# Patient Record
Sex: Female | Born: 1945
Health system: Southern US, Community
[De-identification: ages and names within clinical notes are randomized; demographics above are authoritative.]

## PROBLEM LIST (undated history)

## (undated) DIAGNOSIS — R011 Cardiac murmur, unspecified: Secondary | ICD-10-CM

## (undated) DIAGNOSIS — R519 Headache, unspecified: Secondary | ICD-10-CM

## (undated) DIAGNOSIS — K7460 Unspecified cirrhosis of liver: Secondary | ICD-10-CM

## (undated) DIAGNOSIS — I8500 Esophageal varices without bleeding: Secondary | ICD-10-CM

## (undated) DIAGNOSIS — R51 Headache: Secondary | ICD-10-CM

## (undated) DIAGNOSIS — G459 Transient cerebral ischemic attack, unspecified: Secondary | ICD-10-CM

## (undated) DIAGNOSIS — I10 Essential (primary) hypertension: Secondary | ICD-10-CM

## (undated) HISTORY — PX: BREAST CYST ASPIRATION: SHX578

## (undated) HISTORY — PX: URINARY SURGERY: SHX2626

## (undated) HISTORY — DX: Transient cerebral ischemic attack, unspecified: G45.9

## (undated) HISTORY — PX: WRIST SURGERY: SHX841

## (undated) HISTORY — PX: CHOLECYSTECTOMY: SHX55

---

## 2012-03-01 DIAGNOSIS — J019 Acute sinusitis, unspecified: Secondary | ICD-10-CM | POA: Diagnosis not present

## 2012-04-21 DIAGNOSIS — R0982 Postnasal drip: Secondary | ICD-10-CM | POA: Diagnosis not present

## 2012-04-21 DIAGNOSIS — J019 Acute sinusitis, unspecified: Secondary | ICD-10-CM | POA: Diagnosis not present

## 2012-04-25 DIAGNOSIS — L821 Other seborrheic keratosis: Secondary | ICD-10-CM | POA: Diagnosis not present

## 2012-04-25 DIAGNOSIS — L253 Unspecified contact dermatitis due to other chemical products: Secondary | ICD-10-CM | POA: Diagnosis not present

## 2012-04-25 DIAGNOSIS — D2360 Other benign neoplasm of skin of unspecified upper limb, including shoulder: Secondary | ICD-10-CM | POA: Diagnosis not present

## 2012-04-25 DIAGNOSIS — D239 Other benign neoplasm of skin, unspecified: Secondary | ICD-10-CM | POA: Diagnosis not present

## 2012-05-10 DIAGNOSIS — Z23 Encounter for immunization: Secondary | ICD-10-CM | POA: Diagnosis not present

## 2012-05-10 DIAGNOSIS — L659 Nonscarring hair loss, unspecified: Secondary | ICD-10-CM | POA: Diagnosis not present

## 2012-05-10 DIAGNOSIS — M899 Disorder of bone, unspecified: Secondary | ICD-10-CM | POA: Diagnosis not present

## 2012-05-10 DIAGNOSIS — Z0000 Encounter for general adult medical examination without abnormal findings: Secondary | ICD-10-CM | POA: Diagnosis not present

## 2012-05-10 DIAGNOSIS — I10 Essential (primary) hypertension: Secondary | ICD-10-CM | POA: Diagnosis not present

## 2012-05-11 ENCOUNTER — Other Ambulatory Visit (HOSPITAL_COMMUNITY): Payer: Self-pay | Admitting: Internal Medicine

## 2012-05-11 DIAGNOSIS — Z1231 Encounter for screening mammogram for malignant neoplasm of breast: Secondary | ICD-10-CM

## 2012-05-22 DIAGNOSIS — M899 Disorder of bone, unspecified: Secondary | ICD-10-CM | POA: Diagnosis not present

## 2012-05-22 DIAGNOSIS — E559 Vitamin D deficiency, unspecified: Secondary | ICD-10-CM | POA: Diagnosis not present

## 2012-05-22 DIAGNOSIS — G43009 Migraine without aura, not intractable, without status migrainosus: Secondary | ICD-10-CM | POA: Diagnosis not present

## 2012-05-22 DIAGNOSIS — I10 Essential (primary) hypertension: Secondary | ICD-10-CM | POA: Diagnosis not present

## 2012-05-22 DIAGNOSIS — M949 Disorder of cartilage, unspecified: Secondary | ICD-10-CM | POA: Diagnosis not present

## 2012-06-05 ENCOUNTER — Ambulatory Visit (HOSPITAL_COMMUNITY)
Admission: RE | Admit: 2012-06-05 | Discharge: 2012-06-05 | Disposition: A | Discharge status: Home / Self Care | Payer: Medicare Other | Source: Ambulatory Visit | Attending: Internal Medicine | Admitting: Internal Medicine

## 2012-06-05 DIAGNOSIS — Z1231 Encounter for screening mammogram for malignant neoplasm of breast: Secondary | ICD-10-CM | POA: Insufficient documentation

## 2012-07-03 DIAGNOSIS — M1610 Unilateral primary osteoarthritis, unspecified hip: Secondary | ICD-10-CM | POA: Diagnosis not present

## 2012-07-03 DIAGNOSIS — M169 Osteoarthritis of hip, unspecified: Secondary | ICD-10-CM | POA: Diagnosis not present

## 2012-07-03 DIAGNOSIS — M25569 Pain in unspecified knee: Secondary | ICD-10-CM | POA: Diagnosis not present

## 2012-11-20 DIAGNOSIS — I10 Essential (primary) hypertension: Secondary | ICD-10-CM | POA: Diagnosis not present

## 2012-11-20 DIAGNOSIS — M949 Disorder of cartilage, unspecified: Secondary | ICD-10-CM | POA: Diagnosis not present

## 2012-11-20 DIAGNOSIS — M899 Disorder of bone, unspecified: Secondary | ICD-10-CM | POA: Diagnosis not present

## 2012-11-27 DIAGNOSIS — I10 Essential (primary) hypertension: Secondary | ICD-10-CM | POA: Diagnosis not present

## 2012-11-27 DIAGNOSIS — M899 Disorder of bone, unspecified: Secondary | ICD-10-CM | POA: Diagnosis not present

## 2012-11-27 DIAGNOSIS — H9319 Tinnitus, unspecified ear: Secondary | ICD-10-CM | POA: Diagnosis not present

## 2012-11-27 DIAGNOSIS — Z23 Encounter for immunization: Secondary | ICD-10-CM | POA: Diagnosis not present

## 2012-11-27 DIAGNOSIS — G43009 Migraine without aura, not intractable, without status migrainosus: Secondary | ICD-10-CM | POA: Diagnosis not present

## 2012-11-27 DIAGNOSIS — M949 Disorder of cartilage, unspecified: Secondary | ICD-10-CM | POA: Diagnosis not present

## 2013-06-19 DIAGNOSIS — M949 Disorder of cartilage, unspecified: Secondary | ICD-10-CM | POA: Diagnosis not present

## 2013-06-19 DIAGNOSIS — I10 Essential (primary) hypertension: Secondary | ICD-10-CM | POA: Diagnosis not present

## 2013-06-19 DIAGNOSIS — M899 Disorder of bone, unspecified: Secondary | ICD-10-CM | POA: Diagnosis not present

## 2013-06-19 DIAGNOSIS — Z1331 Encounter for screening for depression: Secondary | ICD-10-CM | POA: Diagnosis not present

## 2013-06-19 DIAGNOSIS — Z0000 Encounter for general adult medical examination without abnormal findings: Secondary | ICD-10-CM | POA: Diagnosis not present

## 2013-06-21 ENCOUNTER — Other Ambulatory Visit (HOSPITAL_COMMUNITY): Payer: Self-pay | Admitting: Internal Medicine

## 2013-06-21 DIAGNOSIS — Z1231 Encounter for screening mammogram for malignant neoplasm of breast: Secondary | ICD-10-CM

## 2013-06-25 DIAGNOSIS — G43009 Migraine without aura, not intractable, without status migrainosus: Secondary | ICD-10-CM | POA: Diagnosis not present

## 2013-06-25 DIAGNOSIS — I10 Essential (primary) hypertension: Secondary | ICD-10-CM | POA: Diagnosis not present

## 2013-06-25 DIAGNOSIS — M899 Disorder of bone, unspecified: Secondary | ICD-10-CM | POA: Diagnosis not present

## 2013-06-27 ENCOUNTER — Ambulatory Visit (HOSPITAL_COMMUNITY)
Admission: RE | Admit: 2013-06-27 | Discharge: 2013-06-27 | Disposition: A | Discharge status: Home / Self Care | Payer: Medicare Other | Source: Ambulatory Visit | Attending: Internal Medicine | Admitting: Internal Medicine

## 2013-06-27 DIAGNOSIS — Z1231 Encounter for screening mammogram for malignant neoplasm of breast: Secondary | ICD-10-CM | POA: Diagnosis not present

## 2014-01-22 DIAGNOSIS — J019 Acute sinusitis, unspecified: Secondary | ICD-10-CM | POA: Diagnosis not present

## 2014-01-22 DIAGNOSIS — M79609 Pain in unspecified limb: Secondary | ICD-10-CM | POA: Diagnosis not present

## 2014-04-08 DIAGNOSIS — I10 Essential (primary) hypertension: Secondary | ICD-10-CM | POA: Diagnosis not present

## 2014-04-08 DIAGNOSIS — M899 Disorder of bone, unspecified: Secondary | ICD-10-CM | POA: Diagnosis not present

## 2014-04-08 DIAGNOSIS — M949 Disorder of cartilage, unspecified: Secondary | ICD-10-CM | POA: Diagnosis not present

## 2014-04-16 DIAGNOSIS — N9489 Other specified conditions associated with female genital organs and menstrual cycle: Secondary | ICD-10-CM | POA: Diagnosis not present

## 2014-04-16 DIAGNOSIS — M949 Disorder of cartilage, unspecified: Secondary | ICD-10-CM | POA: Diagnosis not present

## 2014-04-16 DIAGNOSIS — G43009 Migraine without aura, not intractable, without status migrainosus: Secondary | ICD-10-CM | POA: Diagnosis not present

## 2014-04-16 DIAGNOSIS — M899 Disorder of bone, unspecified: Secondary | ICD-10-CM | POA: Diagnosis not present

## 2014-04-16 DIAGNOSIS — I10 Essential (primary) hypertension: Secondary | ICD-10-CM | POA: Diagnosis not present

## 2014-04-17 DIAGNOSIS — M19049 Primary osteoarthritis, unspecified hand: Secondary | ICD-10-CM | POA: Diagnosis not present

## 2014-04-19 DIAGNOSIS — M19049 Primary osteoarthritis, unspecified hand: Secondary | ICD-10-CM | POA: Diagnosis not present

## 2014-05-03 DIAGNOSIS — S60529A Blister (nonthermal) of unspecified hand, initial encounter: Secondary | ICD-10-CM | POA: Diagnosis not present

## 2014-05-03 DIAGNOSIS — M19049 Primary osteoarthritis, unspecified hand: Secondary | ICD-10-CM | POA: Diagnosis not present

## 2014-05-10 DIAGNOSIS — IMO0001 Reserved for inherently not codable concepts without codable children: Secondary | ICD-10-CM | POA: Diagnosis not present

## 2014-06-04 DIAGNOSIS — M19049 Primary osteoarthritis, unspecified hand: Secondary | ICD-10-CM | POA: Diagnosis not present

## 2014-06-04 DIAGNOSIS — G8918 Other acute postprocedural pain: Secondary | ICD-10-CM | POA: Diagnosis not present

## 2014-06-17 DIAGNOSIS — Z471 Aftercare following joint replacement surgery: Secondary | ICD-10-CM | POA: Diagnosis not present

## 2014-06-17 DIAGNOSIS — Z96698 Presence of other orthopedic joint implants: Secondary | ICD-10-CM | POA: Diagnosis not present

## 2014-07-01 DIAGNOSIS — Z471 Aftercare following joint replacement surgery: Secondary | ICD-10-CM | POA: Diagnosis not present

## 2014-07-01 DIAGNOSIS — M19049 Primary osteoarthritis, unspecified hand: Secondary | ICD-10-CM | POA: Diagnosis not present

## 2014-07-01 DIAGNOSIS — Z96698 Presence of other orthopedic joint implants: Secondary | ICD-10-CM | POA: Diagnosis not present

## 2014-07-09 DIAGNOSIS — H43819 Vitreous degeneration, unspecified eye: Secondary | ICD-10-CM | POA: Diagnosis not present

## 2014-07-11 DIAGNOSIS — M79609 Pain in unspecified limb: Secondary | ICD-10-CM | POA: Diagnosis not present

## 2014-07-17 DIAGNOSIS — M79609 Pain in unspecified limb: Secondary | ICD-10-CM | POA: Diagnosis not present

## 2014-07-23 DIAGNOSIS — M79609 Pain in unspecified limb: Secondary | ICD-10-CM | POA: Diagnosis not present

## 2014-08-01 DIAGNOSIS — M19049 Primary osteoarthritis, unspecified hand: Secondary | ICD-10-CM | POA: Diagnosis not present

## 2014-08-01 DIAGNOSIS — M79609 Pain in unspecified limb: Secondary | ICD-10-CM | POA: Diagnosis not present

## 2014-08-01 DIAGNOSIS — Z471 Aftercare following joint replacement surgery: Secondary | ICD-10-CM | POA: Diagnosis not present

## 2014-08-08 DIAGNOSIS — M79609 Pain in unspecified limb: Secondary | ICD-10-CM | POA: Diagnosis not present

## 2014-08-14 DIAGNOSIS — M79609 Pain in unspecified limb: Secondary | ICD-10-CM | POA: Diagnosis not present

## 2014-08-18 DIAGNOSIS — J0100 Acute maxillary sinusitis, unspecified: Secondary | ICD-10-CM | POA: Diagnosis not present

## 2014-08-22 DIAGNOSIS — M79609 Pain in unspecified limb: Secondary | ICD-10-CM | POA: Diagnosis not present

## 2014-08-29 DIAGNOSIS — Z96698 Presence of other orthopedic joint implants: Secondary | ICD-10-CM | POA: Diagnosis not present

## 2014-09-04 DIAGNOSIS — M79609 Pain in unspecified limb: Secondary | ICD-10-CM | POA: Diagnosis not present

## 2014-09-19 DIAGNOSIS — M79645 Pain in left finger(s): Secondary | ICD-10-CM | POA: Diagnosis not present

## 2014-10-07 DIAGNOSIS — Z0000 Encounter for general adult medical examination without abnormal findings: Secondary | ICD-10-CM | POA: Diagnosis not present

## 2014-10-07 DIAGNOSIS — I10 Essential (primary) hypertension: Secondary | ICD-10-CM | POA: Diagnosis not present

## 2014-10-07 DIAGNOSIS — Z1389 Encounter for screening for other disorder: Secondary | ICD-10-CM | POA: Diagnosis not present

## 2014-10-07 DIAGNOSIS — Z23 Encounter for immunization: Secondary | ICD-10-CM | POA: Diagnosis not present

## 2014-10-07 DIAGNOSIS — M949 Disorder of cartilage, unspecified: Secondary | ICD-10-CM | POA: Diagnosis not present

## 2014-10-07 DIAGNOSIS — N390 Urinary tract infection, site not specified: Secondary | ICD-10-CM | POA: Diagnosis not present

## 2014-10-08 ENCOUNTER — Other Ambulatory Visit (HOSPITAL_COMMUNITY): Payer: Self-pay | Admitting: Internal Medicine

## 2014-10-08 DIAGNOSIS — Z1231 Encounter for screening mammogram for malignant neoplasm of breast: Secondary | ICD-10-CM

## 2014-10-16 ENCOUNTER — Other Ambulatory Visit: Payer: Self-pay | Admitting: Internal Medicine

## 2014-10-16 DIAGNOSIS — G43009 Migraine without aura, not intractable, without status migrainosus: Secondary | ICD-10-CM | POA: Diagnosis not present

## 2014-10-16 DIAGNOSIS — I10 Essential (primary) hypertension: Secondary | ICD-10-CM | POA: Diagnosis not present

## 2014-10-16 DIAGNOSIS — M169 Osteoarthritis of hip, unspecified: Secondary | ICD-10-CM | POA: Diagnosis not present

## 2014-10-16 DIAGNOSIS — M859 Disorder of bone density and structure, unspecified: Secondary | ICD-10-CM | POA: Diagnosis not present

## 2014-10-16 DIAGNOSIS — R748 Abnormal levels of other serum enzymes: Secondary | ICD-10-CM

## 2014-10-17 DIAGNOSIS — M79645 Pain in left finger(s): Secondary | ICD-10-CM | POA: Diagnosis not present

## 2014-10-18 ENCOUNTER — Ambulatory Visit
Admission: RE | Admit: 2014-10-18 | Discharge: 2014-10-18 | Disposition: A | Discharge status: Home / Self Care | Payer: Medicare Other | Source: Ambulatory Visit | Attending: Internal Medicine | Admitting: Internal Medicine

## 2014-10-18 DIAGNOSIS — R748 Abnormal levels of other serum enzymes: Secondary | ICD-10-CM

## 2014-10-18 DIAGNOSIS — Z9049 Acquired absence of other specified parts of digestive tract: Secondary | ICD-10-CM | POA: Diagnosis not present

## 2014-10-24 DIAGNOSIS — M79645 Pain in left finger(s): Secondary | ICD-10-CM | POA: Diagnosis not present

## 2014-10-30 ENCOUNTER — Ambulatory Visit (HOSPITAL_COMMUNITY)
Admission: RE | Admit: 2014-10-30 | Discharge: 2014-10-30 | Disposition: A | Discharge status: Home / Self Care | Payer: Medicare Other | Source: Ambulatory Visit | Attending: Internal Medicine | Admitting: Internal Medicine

## 2014-10-30 DIAGNOSIS — Z1231 Encounter for screening mammogram for malignant neoplasm of breast: Secondary | ICD-10-CM | POA: Diagnosis not present

## 2015-03-05 DIAGNOSIS — R05 Cough: Secondary | ICD-10-CM | POA: Diagnosis not present

## 2015-03-05 DIAGNOSIS — R509 Fever, unspecified: Secondary | ICD-10-CM | POA: Diagnosis not present

## 2015-03-05 DIAGNOSIS — J209 Acute bronchitis, unspecified: Secondary | ICD-10-CM | POA: Diagnosis not present

## 2015-03-12 DIAGNOSIS — R05 Cough: Secondary | ICD-10-CM | POA: Diagnosis not present

## 2015-03-12 DIAGNOSIS — J309 Allergic rhinitis, unspecified: Secondary | ICD-10-CM | POA: Diagnosis not present

## 2015-04-21 DIAGNOSIS — M859 Disorder of bone density and structure, unspecified: Secondary | ICD-10-CM | POA: Diagnosis not present

## 2015-04-21 DIAGNOSIS — I10 Essential (primary) hypertension: Secondary | ICD-10-CM | POA: Diagnosis not present

## 2015-04-30 DIAGNOSIS — M859 Disorder of bone density and structure, unspecified: Secondary | ICD-10-CM | POA: Diagnosis not present

## 2015-04-30 DIAGNOSIS — G43009 Migraine without aura, not intractable, without status migrainosus: Secondary | ICD-10-CM | POA: Diagnosis not present

## 2015-04-30 DIAGNOSIS — J309 Allergic rhinitis, unspecified: Secondary | ICD-10-CM | POA: Diagnosis not present

## 2015-04-30 DIAGNOSIS — I10 Essential (primary) hypertension: Secondary | ICD-10-CM | POA: Diagnosis not present

## 2015-05-13 DIAGNOSIS — Z1211 Encounter for screening for malignant neoplasm of colon: Secondary | ICD-10-CM | POA: Diagnosis not present

## 2015-05-13 DIAGNOSIS — R140 Abdominal distension (gaseous): Secondary | ICD-10-CM | POA: Diagnosis not present

## 2015-05-23 DIAGNOSIS — Z1211 Encounter for screening for malignant neoplasm of colon: Secondary | ICD-10-CM | POA: Diagnosis not present

## 2015-05-23 DIAGNOSIS — K5730 Diverticulosis of large intestine without perforation or abscess without bleeding: Secondary | ICD-10-CM | POA: Diagnosis not present

## 2015-07-11 DIAGNOSIS — M79644 Pain in right finger(s): Secondary | ICD-10-CM | POA: Diagnosis not present

## 2015-07-11 DIAGNOSIS — Z471 Aftercare following joint replacement surgery: Secondary | ICD-10-CM | POA: Diagnosis not present

## 2015-07-11 DIAGNOSIS — M778 Other enthesopathies, not elsewhere classified: Secondary | ICD-10-CM | POA: Diagnosis not present

## 2015-07-11 DIAGNOSIS — M79645 Pain in left finger(s): Secondary | ICD-10-CM | POA: Diagnosis not present

## 2015-10-24 DIAGNOSIS — R399 Unspecified symptoms and signs involving the genitourinary system: Secondary | ICD-10-CM | POA: Diagnosis not present

## 2015-10-24 DIAGNOSIS — N898 Other specified noninflammatory disorders of vagina: Secondary | ICD-10-CM | POA: Diagnosis not present

## 2015-10-24 DIAGNOSIS — T192XXA Foreign body in vulva and vagina, initial encounter: Secondary | ICD-10-CM | POA: Diagnosis not present

## 2015-10-27 DIAGNOSIS — M8580 Other specified disorders of bone density and structure, unspecified site: Secondary | ICD-10-CM | POA: Diagnosis not present

## 2015-10-27 DIAGNOSIS — Z23 Encounter for immunization: Secondary | ICD-10-CM | POA: Diagnosis not present

## 2015-10-27 DIAGNOSIS — Z0000 Encounter for general adult medical examination without abnormal findings: Secondary | ICD-10-CM | POA: Diagnosis not present

## 2015-10-27 DIAGNOSIS — M859 Disorder of bone density and structure, unspecified: Secondary | ICD-10-CM | POA: Diagnosis not present

## 2015-10-27 DIAGNOSIS — Z1389 Encounter for screening for other disorder: Secondary | ICD-10-CM | POA: Diagnosis not present

## 2015-10-27 DIAGNOSIS — I10 Essential (primary) hypertension: Secondary | ICD-10-CM | POA: Diagnosis not present

## 2015-10-27 DIAGNOSIS — N390 Urinary tract infection, site not specified: Secondary | ICD-10-CM | POA: Diagnosis not present

## 2015-10-27 DIAGNOSIS — Z780 Asymptomatic menopausal state: Secondary | ICD-10-CM | POA: Diagnosis not present

## 2015-10-28 ENCOUNTER — Other Ambulatory Visit: Payer: Self-pay

## 2015-10-28 DIAGNOSIS — Z1231 Encounter for screening mammogram for malignant neoplasm of breast: Secondary | ICD-10-CM

## 2015-11-04 DIAGNOSIS — N959 Unspecified menopausal and perimenopausal disorder: Secondary | ICD-10-CM | POA: Diagnosis not present

## 2015-11-04 DIAGNOSIS — N952 Postmenopausal atrophic vaginitis: Secondary | ICD-10-CM | POA: Diagnosis not present

## 2015-11-06 ENCOUNTER — Other Ambulatory Visit: Payer: Self-pay | Admitting: Family Medicine

## 2015-11-06 DIAGNOSIS — N950 Postmenopausal bleeding: Secondary | ICD-10-CM

## 2015-11-06 DIAGNOSIS — R102 Pelvic and perineal pain: Secondary | ICD-10-CM

## 2015-11-07 DIAGNOSIS — N182 Chronic kidney disease, stage 2 (mild): Secondary | ICD-10-CM | POA: Diagnosis not present

## 2015-11-07 DIAGNOSIS — I129 Hypertensive chronic kidney disease with stage 1 through stage 4 chronic kidney disease, or unspecified chronic kidney disease: Secondary | ICD-10-CM | POA: Diagnosis not present

## 2015-11-07 DIAGNOSIS — G43009 Migraine without aura, not intractable, without status migrainosus: Secondary | ICD-10-CM | POA: Diagnosis not present

## 2015-11-07 DIAGNOSIS — M8580 Other specified disorders of bone density and structure, unspecified site: Secondary | ICD-10-CM | POA: Diagnosis not present

## 2015-11-17 ENCOUNTER — Ambulatory Visit
Admission: RE | Admit: 2015-11-17 | Discharge: 2015-11-17 | Disposition: A | Discharge status: Home / Self Care | Payer: Medicare Other | Source: Ambulatory Visit | Attending: Family Medicine | Admitting: Family Medicine

## 2015-11-17 DIAGNOSIS — N950 Postmenopausal bleeding: Secondary | ICD-10-CM

## 2015-11-17 DIAGNOSIS — R102 Pelvic and perineal pain: Secondary | ICD-10-CM

## 2015-11-18 ENCOUNTER — Ambulatory Visit: Payer: PRIVATE HEALTH INSURANCE

## 2015-11-19 ENCOUNTER — Ambulatory Visit
Admission: RE | Admit: 2015-11-19 | Discharge: 2015-11-19 | Disposition: A | Discharge status: Home / Self Care | Payer: Medicare Other | Source: Ambulatory Visit

## 2015-11-19 DIAGNOSIS — Z1231 Encounter for screening mammogram for malignant neoplasm of breast: Secondary | ICD-10-CM

## 2015-11-25 DIAGNOSIS — N882 Stricture and stenosis of cervix uteri: Secondary | ICD-10-CM | POA: Diagnosis not present

## 2015-11-25 DIAGNOSIS — N952 Postmenopausal atrophic vaginitis: Secondary | ICD-10-CM | POA: Diagnosis not present

## 2015-11-25 DIAGNOSIS — R102 Pelvic and perineal pain: Secondary | ICD-10-CM | POA: Diagnosis not present

## 2015-11-25 DIAGNOSIS — N950 Postmenopausal bleeding: Secondary | ICD-10-CM | POA: Diagnosis not present

## 2015-12-23 ENCOUNTER — Encounter (HOSPITAL_COMMUNITY)
Admission: RE | Admit: 2015-12-23 | Discharge: 2015-12-23 | Disposition: A | Discharge status: Home / Self Care | Payer: Medicare Other | Source: Ambulatory Visit | Attending: Obstetrics & Gynecology | Admitting: Obstetrics & Gynecology

## 2015-12-23 ENCOUNTER — Encounter (HOSPITAL_COMMUNITY): Payer: Self-pay

## 2015-12-23 ENCOUNTER — Other Ambulatory Visit: Payer: Self-pay

## 2015-12-23 DIAGNOSIS — Z01818 Encounter for other preprocedural examination: Secondary | ICD-10-CM | POA: Diagnosis not present

## 2015-12-23 DIAGNOSIS — N950 Postmenopausal bleeding: Secondary | ICD-10-CM | POA: Diagnosis not present

## 2015-12-23 DIAGNOSIS — N882 Stricture and stenosis of cervix uteri: Secondary | ICD-10-CM | POA: Diagnosis not present

## 2015-12-23 HISTORY — DX: Headache: R51

## 2015-12-23 HISTORY — DX: Cardiac murmur, unspecified: R01.1

## 2015-12-23 HISTORY — DX: Headache, unspecified: R51.9

## 2015-12-23 LAB — CBC
HCT: 42.6 % (ref 36.0–46.0)
Hemoglobin: 14.5 g/dL (ref 12.0–15.0)
MCH: 29.2 pg (ref 26.0–34.0)
MCHC: 34 g/dL (ref 30.0–36.0)
MCV: 85.9 fL (ref 78.0–100.0)
Platelets: 148 10*3/uL — ABNORMAL LOW (ref 150–400)
RBC: 4.96 MIL/uL (ref 3.87–5.11)
RDW: 13.9 % (ref 11.5–15.5)
WBC: 7 10*3/uL (ref 4.0–10.5)

## 2015-12-23 LAB — COMPREHENSIVE METABOLIC PANEL
ALT: 20 U/L (ref 14–54)
ANION GAP: 9 (ref 5–15)
AST: 24 U/L (ref 15–41)
Albumin: 4.5 g/dL (ref 3.5–5.0)
Alkaline Phosphatase: 153 U/L — ABNORMAL HIGH (ref 38–126)
BUN: 20 mg/dL (ref 6–20)
CHLORIDE: 106 mmol/L (ref 101–111)
CO2: 26 mmol/L (ref 22–32)
CREATININE: 0.88 mg/dL (ref 0.44–1.00)
Calcium: 9.8 mg/dL (ref 8.9–10.3)
GFR calc Af Amer: >60 mL/min (ref >60)
GFR calc non Af Amer: >60 mL/min (ref >60)
Glucose, Bld: 114 mg/dL — ABNORMAL HIGH (ref 65–99)
Potassium: 3.6 mmol/L (ref 3.5–5.1)
SODIUM: 141 mmol/L (ref 135–145)
Total Bilirubin: 0.5 mg/dL (ref 0.3–1.2)
Total Protein: 7.9 g/dL (ref 6.5–8.1)

## 2015-12-23 NOTE — Patient Instructions (Addendum)
   Your procedure is scheduled on: January 9 (MONDAY)  Enter through the Main Entrance of [facility] at: [place] up the phone at the desk and dial [phone] and inform us of your arrival.  Please call this number if you have any problems the morning of surgery: [phone]  DO NOT EAT OR DRINK AFTER MIDNIGHT January 8 (SUNDAY)   Take these medicines the morning of surgery with a SIP OF WATER: take micardis day of surgery  Do not wear jewelry, make-up, or FINGER nail polish No metal in your hair or on your body. Do not wear lotions, powders, perfumes.  You may wear deodorant.  Do not bring valuables to the hospital. Contacts, dentures or bridgework may not be worn into surgery.   Patients discharged on the day of surgery will not be allowed to drive home.

## 2015-12-23 NOTE — H&P (Signed)
70yo PM female who presents for hysteroscopy D&C due to postmenopausal bleeding with cervical stenosis.  Since October she has had very light old blood spotting- sometimes its more than a few drops, most of the time its very minimal. Denies BRB or heavy bleeding.  Pelvic US performed: 6cm uterus- no uterine abnromalities noted. 1.31mm endometrial stripe. Normal right ovary, left ovary not visualized. Of note ~ 75mm of non-specific fluid noted in endocervical canal.  Unable to perform EMB in office due to cervical stenosis.  Current Medications  Taking   Telmisartan 80 MG Tablet Once a day   Calcium + D3 600-200 MG-UNIT Tablet once a day   Ginkgo Biloba 120 MG Capsule 2 tablets once a day   Fish Oil 1000 MG Capsule once a day   Vitamin D3 2000 UNIT Capsule Once a day            Past Medical History  Atrophic vaginitis  Hypertension  Vitamin D deficiency   laparoscopic cholecystectomy microscopic 2008  tubal ligation 1974  mesh for uterus dropping 2005   Family History  Father: deceased 63 yrs, Passed due to massive cerebral hemorrhage  Mother: deceased 66 yrs, Passed due to an accident  Brother 1: alive 31 yrs, diagnosed with Prostate Ca  Brother2: alive 74 yrs, diagnosed with Prostate Ca  Brother 3: alive  Sister 1: alive 71 yrs, alzheimer, diagnosed with HTN  Sister 2: alive, diagnosed with HTN  Sister 3: alive  11 brothers, four sisters all togather. Positive family Hx for colon cancer., denies any GYN family cancer hx.   Social History  General:  Tobacco use  cigarettes: Never smoked Tobacco history last updated 11/25/2015 no Alcohol.  Caffeine: yes, 1 serving daily coffee, , occasionally tea.  no Recreational drug use.  Exercise: yes, daily, walks.  Marital Status: married.  Children: 1 girl, 1 son.  OCCUPATION: retired.    Gyn History  Sexual activity not currently sexually active.  Periods : postmenopausal.  Birth control BTL.  Last pap smear date More than 5  years ago.  Last mammogram date 11/19/2015.  Denies H/O Abnormal pap smear.  Denies H/O STD.    OB History  Pregnancy # 1 live birth, vaginal delivery.  Pregnancy # 2 live birth, vaginal delivery.    Allergies  N.K.D.A.   Hospitalization/Major Diagnostic Procedure  child birth 7  child birth 62   Review of Systems  CONSTITUTIONAL:  no Appetite changes. no Chills. no Fatigue. no Fever.  CARDIOLOGY:  no Chest pain.  RESPIRATORY:  no Shortness of breath. no Cough.  UROLOGY:  no Dysuria. no Urinary frequency. no Urinary incontinence. no Urinary urgency.  GASTROENTEROLOGY:  no Change in bowel habits. no Change in bowel movements.  FEMALE REPRODUCTIVE:  no Breast lumps or discharge. no Breast pain. no Hot flashes. no Sexual problems. no Vaginal itching.  NEUROLOGY:  no Dizziness. no Headache.  PSYCHOLOGY:  no Anxiety. no Depression.  SKIN:  no Rash. no Hives.  HEMATOLOGY/LYMPH:  no Anemia. Using Blood Thinners no.    O: Examination performed in office  GENERAL APPEARANCE well nourished, well developed.  SKIN: warm & dry, no rashes.  NECK: supple, normal appearance, thyroid not enlarged.  BREASTS: no palpable masses bilaterally, no nipple discharge bilaterally.  LUNGS: clear to auscultation bilaterally, no wheezes, rhonchi, rales.  HEART: no murmurs, regular rate and rhythm.  ABDOMEN: no masses palpated, soft and not tender, no rebound, no guarding, no reproducible pain.  FEMALE GENITOURINARY: Normal urethra, No  external lesions, Vagina - pink pale mucosa, no lesions or abnormal discharge, cervix - visualized, cervical stenosis noted, cervix - no discharge or lesions or CMT, adnexa - no masses or tenderness, uterus - nontender and normal size on palpation.  EXTREMITIES: no edema present, no calf tenderness bilaterally, normal range of motion.  LYMPH NODES: no inguinal adenopathy, no axillary adenopathy.  PSYCH oriented x 3, appropriate mood and affect.    A/P: 70yo  postmenopausal female who presents for hysteroscopy, D&C -NPO -LR @ 125cc/hr -SCDs to OR -antibiotics not indicated -Risk/benefit and indications of procedure reviewed with patient.  Questions and concerns were addressed and pt wishes to proceed  Janyth Pupa, DO [phone] (pager) [phone] (office)

## 2015-12-29 ENCOUNTER — Encounter (HOSPITAL_COMMUNITY)
Admission: RE | Disposition: A | Discharge status: Home / Self Care | Payer: Self-pay | Source: Ambulatory Visit | Attending: Obstetrics & Gynecology

## 2015-12-29 ENCOUNTER — Ambulatory Visit (HOSPITAL_COMMUNITY): Payer: Medicare Other | Admitting: Certified Registered Nurse Anesthetist

## 2015-12-29 ENCOUNTER — Ambulatory Visit (HOSPITAL_COMMUNITY)
Admission: RE | Admit: 2015-12-29 | Discharge: 2015-12-29 | Disposition: A | Discharge status: Home / Self Care | Payer: Medicare Other | Source: Ambulatory Visit | Attending: Obstetrics & Gynecology | Admitting: Obstetrics & Gynecology

## 2015-12-29 DIAGNOSIS — I10 Essential (primary) hypertension: Secondary | ICD-10-CM | POA: Diagnosis not present

## 2015-12-29 DIAGNOSIS — M4802 Spinal stenosis, cervical region: Secondary | ICD-10-CM | POA: Diagnosis not present

## 2015-12-29 DIAGNOSIS — N882 Stricture and stenosis of cervix uteri: Secondary | ICD-10-CM | POA: Diagnosis not present

## 2015-12-29 DIAGNOSIS — N858 Other specified noninflammatory disorders of uterus: Secondary | ICD-10-CM | POA: Diagnosis not present

## 2015-12-29 DIAGNOSIS — N950 Postmenopausal bleeding: Secondary | ICD-10-CM | POA: Diagnosis not present

## 2015-12-29 HISTORY — PX: HYSTEROSCOPY WITH D & C: SHX1775

## 2015-12-29 SURGERY — DILATATION AND CURETTAGE /HYSTEROSCOPY
Anesthesia: Monitor Anesthesia Care | Site: Vagina

## 2015-12-29 MED ORDER — SCOPOLAMINE 1 MG/3DAYS TD PT72: 1.0000 | MEDICATED_PATCH | Freq: Once | TRANSDERMAL | Status: DC

## 2015-12-29 MED ORDER — DEXAMETHASONE SODIUM PHOSPHATE 10 MG/ML IJ SOLN
INTRAMUSCULAR | Status: AC
  Filled 2015-12-29: qty 1

## 2015-12-29 MED ORDER — PROPOFOL 500 MG/50ML IV EMUL
INTRAVENOUS | Status: DC | PRN
  Administered 2015-12-29: 200 ug/kg/min via INTRAVENOUS

## 2015-12-29 MED ORDER — FENTANYL CITRATE (PF) 100 MCG/2ML IJ SOLN
INTRAMUSCULAR | Status: AC
Start: 2015-12-29 — End: 2015-12-29
  Filled 2015-12-29: qty 2

## 2015-12-29 MED ORDER — ONDANSETRON HCL 4 MG/2ML IJ SOLN
INTRAMUSCULAR | Status: AC
  Filled 2015-12-29: qty 2

## 2015-12-29 MED ORDER — LIDOCAINE HCL 1 % IJ SOLN
INTRAMUSCULAR | Status: AC
  Filled 2015-12-29: qty 20

## 2015-12-29 MED ORDER — LIDOCAINE HCL (CARDIAC) 20 MG/ML IV SOLN
INTRAVENOUS | Status: AC
  Filled 2015-12-29: qty 5

## 2015-12-29 MED ORDER — FENTANYL CITRATE (PF) 100 MCG/2ML IJ SOLN: 25.0000 ug | INTRAMUSCULAR | Status: DC | PRN

## 2015-12-29 MED ORDER — KETOROLAC TROMETHAMINE 30 MG/ML IJ SOLN
INTRAMUSCULAR | Status: AC
  Filled 2015-12-29: qty 1

## 2015-12-29 MED ORDER — GLYCOPYRROLATE 0.2 MG/ML IJ SOLN
INTRAMUSCULAR | Status: AC
  Filled 2015-12-29: qty 1

## 2015-12-29 MED ORDER — PROPOFOL 10 MG/ML IV BOLUS
INTRAVENOUS | Status: AC
  Filled 2015-12-29: qty 20

## 2015-12-29 MED ORDER — ACETAMINOPHEN 160 MG/5ML PO SOLN: 325.0000 mg | ORAL | Status: DC | PRN

## 2015-12-29 MED ORDER — MIDAZOLAM HCL 2 MG/2ML IJ SOLN
INTRAMUSCULAR | Status: DC | PRN
  Administered 2015-12-29: 1 mg via INTRAVENOUS

## 2015-12-29 MED ORDER — OXYCODONE HCL 5 MG/5ML PO SOLN: 5.0000 mg | Freq: Once | ORAL | Status: DC | PRN

## 2015-12-29 MED ORDER — LIDOCAINE HCL 1 % IJ SOLN
INTRAMUSCULAR | Status: DC | PRN
  Administered 2015-12-29: 10 mL

## 2015-12-29 MED ORDER — LIDOCAINE HCL (CARDIAC) 20 MG/ML IV SOLN
INTRAVENOUS | Status: DC | PRN
  Administered 2015-12-29 (×2): 50 mg via INTRAVENOUS

## 2015-12-29 MED ORDER — LACTATED RINGERS IV SOLN
INTRAVENOUS | Status: DC
  Administered 2015-12-29: 08:00:00 via INTRAVENOUS

## 2015-12-29 MED ORDER — OXYCODONE HCL 5 MG PO TABS: 5.0000 mg | ORAL_TABLET | Freq: Once | ORAL | Status: DC | PRN

## 2015-12-29 MED ORDER — ONDANSETRON HCL 4 MG/2ML IJ SOLN
INTRAMUSCULAR | Status: DC | PRN
  Administered 2015-12-29: 4 mg via INTRAVENOUS

## 2015-12-29 MED ORDER — SODIUM CHLORIDE 0.9 % IR SOLN
Status: DC | PRN
  Administered 2015-12-29: 3000 mL

## 2015-12-29 MED ORDER — FENTANYL CITRATE (PF) 100 MCG/2ML IJ SOLN
INTRAMUSCULAR | Status: DC | PRN
  Administered 2015-12-29 (×2): 25 ug via INTRAVENOUS

## 2015-12-29 MED ORDER — GLYCOPYRROLATE 0.2 MG/ML IJ SOLN
INTRAMUSCULAR | Status: DC | PRN
  Administered 2015-12-29: 0.1 mg via INTRAVENOUS

## 2015-12-29 MED ORDER — LACTATED RINGERS IV SOLN: INTRAVENOUS | Status: DC

## 2015-12-29 MED ORDER — ACETAMINOPHEN 325 MG PO TABS: 325.0000 mg | ORAL_TABLET | ORAL | Status: DC | PRN

## 2015-12-29 MED ORDER — DEXAMETHASONE SODIUM PHOSPHATE 10 MG/ML IJ SOLN
INTRAMUSCULAR | Status: DC | PRN
  Administered 2015-12-29: 5 mg via INTRAVENOUS

## 2015-12-29 MED ORDER — MIDAZOLAM HCL 2 MG/2ML IJ SOLN
INTRAMUSCULAR | Status: AC
  Filled 2015-12-29: qty 2

## 2015-12-29 SURGICAL SUPPLY — 17 items
CANISTER SUCT 3000ML (MISCELLANEOUS) ×2 IMPLANT
CATH ROBINSON RED A/P 16FR (CATHETERS) ×2 IMPLANT
CLOTH BEACON ORANGE TIMEOUT ST (SAFETY) ×2 IMPLANT
CONTAINER PREFILL 10% NBF 60ML (FORM) ×4 IMPLANT
DILATOR CANAL MILEX (MISCELLANEOUS) ×2 IMPLANT
GLOVE BIOGEL PI IND STRL 6.5 (GLOVE) ×2 IMPLANT
GLOVE BIOGEL PI IND STRL 7.0 (GLOVE) ×2 IMPLANT
GLOVE BIOGEL PI INDICATOR 6.5 (GLOVE) ×2
GLOVE BIOGEL PI INDICATOR 7.0 (GLOVE) ×2
GLOVE ECLIPSE 6.5 STRL STRAW (GLOVE) ×2 IMPLANT
GOWN STRL REUS W/TWL LRG LVL3 (GOWN DISPOSABLE) ×4 IMPLANT
PACK VAGINAL MINOR WOMEN LF (CUSTOM PROCEDURE TRAY) ×2 IMPLANT
PAD OB MATERNITY 4.3X12.25 (PERSONAL CARE ITEMS) ×2 IMPLANT
TOWEL OR 17X24 6PK STRL BLUE (TOWEL DISPOSABLE) ×4 IMPLANT
TUBING AQUILEX INFLOW (TUBING) ×2 IMPLANT
TUBING AQUILEX OUTFLOW (TUBING) ×2 IMPLANT
WATER STERILE IRR 1000ML POUR (IV SOLUTION) ×2 IMPLANT

## 2015-12-29 NOTE — Op Note (Signed)
Operative Report  PreOp: postmenopausal bleeding, cervical stenosis PostOp: same Procedure:  Hysteroscopy, Dilation and Curettage Surgeon: Dr. Janyth Pupa Anesthesia: MAC Complications:uterine perforation EBL: 5cc UOP: 20cc IVF:700cc Deficit: 60cc  Findings:Cervical stenosis, 6cm uterus with thin endometrium  Specimens: 1) ECC 2) EMB   Procedure: The patient was taken to the operating room where she underwent conscious sedation. The patient was placed in a low lithotomy position using Allen stirrups. She was prepped and draped in the normal sterile fashion. The bladder was drained using a red rubber urethral catheter. A sterile speculum was inserted into the vagina. A single tooth tenaculum was placed on the anterior lip of the cervix. 10cc of 1% lidocaine with injected for a cervical block. Stenosis was noted- using the os finder- the uterus was dilated.  Mucus was noted upon opening of the cervix and likely explains the US findings.  The uterus was then sounded to 6cm. The endocervical canal was then serially dilated to 14French using Hank dilators.  The diagnostic hysteroscope was then inserted without difficulty and noted to have the findings as listed above. Of note a small dark area noted at the fundus- likely injury from the dilation- no active bleeding was seen.  The hysteroscope was removed and sharp curettage was performed. The tissue was sent to pathology. The patient was repositioned to the supine position. Pt was monitored with no evidence of active bleeding.  The patient tolerated the procedure with small suspected uterine perforation as mentioned above.  She was taken to recovery in stable condition.  Janyth Pupa, DO [phone] (pager) [phone] (office)

## 2015-12-29 NOTE — Discharge Instructions (Addendum)
HOME INSTRUCTIONS  Please note any unusual or excessive bleeding, pain, swelling. Mild dizziness or drowsiness are normal for about 24 hours after surgery.   Shower when comfortable  Restrictions: No driving for 24 hours or while taking pain medications.  Activity:  No heavy lifting (> 10 lbs), nothing in vagina (no tampons, douching, or intercourse) x 2 weeks; no tub baths for 2 weeks Vaginal spotting is expected but if your bleeding is heavy, period like,  please call the office    Diet:  You may return to your regular diet.  Do not eat large meals.  Eat small frequent meals throughout the day.  Continue to drink a good amount of water at least 6-8 glasses of water per day, hydration is very important for the healing process.  Pain Management: Take Motrin and/or Tylenol as needed for pain.  Always take prescription pain medication with food, it may cause constipation, increase fluids and fiber and you may want to take an over-the-counter stool softener like Colace as needed up to 2x a day.    Alcohol -- Avoid for 24 hours and while taking pain medications.  Nausea: Take sips of ginger ale or soda  Fever -- Call physician if temperature over 101 degrees  Follow up:  If you do not already have a follow up appointment scheduled, please call the office at [phone].  If you experience fever (a temperature greater than 100.4), pain unrelieved by pain medication, shortness of breath, swelling of a single leg, or any other symptoms which are concerning to you please the office immediately.DISCHARGE INSTRUCTIONS: D&C / D&E The following instructions have been prepared to help you care for yourself upon your return home.   Personal hygiene:  Use sanitary pads for vaginal drainage, not tampons.  Shower the day after your procedure.  NO tub baths, pools or Jacuzzis for 2-3 weeks.  Wipe front to back after using the bathroom.  Activity and limitations:  Do NOT drive or operate any  equipment for 24 hours. The effects of anesthesia are still present and drowsiness may result.  Do NOT rest in bed all day.  Walking is encouraged.  Walk up and down stairs slowly.  You may resume your normal activity in one to two days or as indicated by your physician.  Sexual activity: NO intercourse for at least 2 weeks after the procedure, or as indicated by your physician.  Diet: Eat a light meal as desired this evening. You may resume your usual diet tomorrow.  Return to work: You may resume your work activities in one to two days or as indicated by your doctor.  What to expect after your surgery: Expect to have vaginal bleeding/discharge for 2-3 days and spotting for up to 10 days. It is not unusual to have soreness for up to 1-2 weeks. You may have a slight burning sensation when you urinate for the first day. Mild cramps may continue for a couple of days. You may have a regular period in 2-6 weeks.  Call your doctor for any of the following:  Excessive vaginal bleeding, saturating and changing one pad every hour.  Inability to urinate 6 hours after discharge from hospital.  Pain not relieved by pain medication.  Fever of 100.4 F or greater.  Unusual vaginal discharge or odor.   Call for an appointment:    Patients signature: ______________________  Nurses signature ________________________  Support person's signature_______________________

## 2015-12-29 NOTE — Anesthesia Preprocedure Evaluation (Signed)
Anesthesia Evaluation  Patient identified by MRN, date of birth, ID band Patient awake    Reviewed: Allergy & Precautions, NPO status , Patient's Chart, lab work & pertinent test results  History of Anesthesia Complications Negative for: history of anesthetic complications  Airway Mallampati: II  TM Distance: >3 FB Neck ROM: Full    Dental  (+) Teeth Intact   Pulmonary neg pulmonary ROS,    breath sounds clear to auscultation       Cardiovascular hypertension, Pt. on medications  Rhythm:Regular     Neuro/Psych negative neurological ROS  negative psych ROS   GI/Hepatic negative GI ROS, Neg liver ROS,   Endo/Other  negative endocrine ROS  Renal/GU negative Renal ROS     Musculoskeletal negative musculoskeletal ROS (+)   Abdominal   Peds  Hematology negative hematology ROS (+)   Anesthesia Other Findings   Reproductive/Obstetrics                             Anesthesia Physical Anesthesia Plan  ASA: II  Anesthesia Plan: MAC   Post-op Pain Management:    Induction: Intravenous  Airway Management Planned: Natural Airway, Simple Face Mask and Nasal Cannula  Additional Equipment: None  Intra-op Plan:   Post-operative Plan:   Informed Consent: I have reviewed the patients History and Physical, chart, labs and discussed the procedure including the risks, benefits and alternatives for the proposed anesthesia with the patient or authorized representative who has indicated his/her understanding and acceptance.   Dental advisory given  Plan Discussed with: CRNA and Surgeon  Anesthesia Plan Comments:         Anesthesia Quick Evaluation

## 2015-12-29 NOTE — Transfer of Care (Signed)
Immediate Anesthesia Transfer of Care Note  Patient: Terri Turner  Procedure(s) Performed: Procedure(s): DILATATION AND CURETTAGE /HYSTEROSCOPY (N/A)  Patient Location: PACU  Anesthesia Type:MAC  Level of Consciousness: awake, alert  and oriented  Airway & Oxygen Therapy: Patient Spontanous Breathing and Patient connected to nasal cannula oxygen  Post-op Assessment: Report given to RN, Post -op Vital signs reviewed and stable and Patient moving all extremities X 4  Post vital signs: Reviewed and stable  Last Vitals:  Filed Vitals:   12/29/15 0816  BP: 138/79  Pulse: 69  Temp: 36.4 C  Resp: 20    Complications: No apparent anesthesia complications

## 2015-12-29 NOTE — Interval H&P Note (Signed)
History and Physical Interval Note:  12/29/2015 8:35 AM  Terri Turner  has presented today for surgery, with the diagnosis of N95.0  PMB N88.2 Cervical Stenosis  The various methods of treatment have been discussed with the patient and family. After consideration of risks, benefits and other options for treatment, the patient has consented to  Procedure(s): DILATATION AND CURETTAGE /HYSTEROSCOPY (N/A) as a surgical intervention .  The patient's history has been reviewed, patient examined, no change in status, stable for surgery.  I have reviewed the patient's chart and labs.  Questions were answered to the patient's satisfaction.     Janyth Pupa, M

## 2015-12-29 NOTE — Anesthesia Postprocedure Evaluation (Signed)
Anesthesia Post Note  Patient: Terri Turner  Procedure(s) Performed: Procedure(s) (LRB): DILATATION AND CURETTAGE /HYSTEROSCOPY (N/A)  Patient location during evaluation: PACU Anesthesia Type: MAC Level of consciousness: awake Pain management: pain level controlled Vital Signs Assessment: post-procedure vital signs reviewed and stable Respiratory status: spontaneous breathing Cardiovascular status: stable Postop Assessment: no signs of nausea or vomiting and adequate PO intake Anesthetic complications: no    Last Vitals:  Filed Vitals:   12/29/15 1045 12/29/15 1047  BP: 121/70   Pulse: 65 73  Temp:  36.7 C  Resp: 13 18    Last Pain: There were no vitals filed for this visit.               [place]

## 2015-12-30 ENCOUNTER — Encounter (HOSPITAL_COMMUNITY): Payer: Self-pay | Admitting: Obstetrics & Gynecology

## 2016-01-12 DIAGNOSIS — Z4889 Encounter for other specified surgical aftercare: Secondary | ICD-10-CM | POA: Diagnosis not present

## 2016-01-12 DIAGNOSIS — N952 Postmenopausal atrophic vaginitis: Secondary | ICD-10-CM | POA: Diagnosis not present

## 2016-01-12 DIAGNOSIS — N898 Other specified noninflammatory disorders of vagina: Secondary | ICD-10-CM | POA: Diagnosis not present

## 2016-01-12 DIAGNOSIS — N362 Urethral caruncle: Secondary | ICD-10-CM | POA: Diagnosis not present

## 2016-04-30 DIAGNOSIS — M8580 Other specified disorders of bone density and structure, unspecified site: Secondary | ICD-10-CM | POA: Diagnosis not present

## 2016-04-30 DIAGNOSIS — E559 Vitamin D deficiency, unspecified: Secondary | ICD-10-CM | POA: Diagnosis not present

## 2016-04-30 DIAGNOSIS — I129 Hypertensive chronic kidney disease with stage 1 through stage 4 chronic kidney disease, or unspecified chronic kidney disease: Secondary | ICD-10-CM | POA: Diagnosis not present

## 2016-05-04 DIAGNOSIS — I129 Hypertensive chronic kidney disease with stage 1 through stage 4 chronic kidney disease, or unspecified chronic kidney disease: Secondary | ICD-10-CM | POA: Diagnosis not present

## 2016-05-04 DIAGNOSIS — M8580 Other specified disorders of bone density and structure, unspecified site: Secondary | ICD-10-CM | POA: Diagnosis not present

## 2016-05-04 DIAGNOSIS — E559 Vitamin D deficiency, unspecified: Secondary | ICD-10-CM | POA: Diagnosis not present

## 2016-05-04 DIAGNOSIS — I10 Essential (primary) hypertension: Secondary | ICD-10-CM | POA: Diagnosis not present

## 2016-05-06 ENCOUNTER — Other Ambulatory Visit: Payer: Self-pay | Admitting: Obstetrics & Gynecology

## 2016-05-06 DIAGNOSIS — N950 Postmenopausal bleeding: Secondary | ICD-10-CM | POA: Diagnosis not present

## 2016-05-06 DIAGNOSIS — N362 Urethral caruncle: Secondary | ICD-10-CM | POA: Diagnosis not present

## 2016-05-06 DIAGNOSIS — R319 Hematuria, unspecified: Secondary | ICD-10-CM | POA: Diagnosis not present

## 2016-05-25 DIAGNOSIS — G43009 Migraine without aura, not intractable, without status migrainosus: Secondary | ICD-10-CM | POA: Diagnosis not present

## 2016-05-25 DIAGNOSIS — N182 Chronic kidney disease, stage 2 (mild): Secondary | ICD-10-CM | POA: Diagnosis not present

## 2016-05-25 DIAGNOSIS — M8580 Other specified disorders of bone density and structure, unspecified site: Secondary | ICD-10-CM | POA: Diagnosis not present

## 2016-05-25 DIAGNOSIS — I129 Hypertensive chronic kidney disease with stage 1 through stage 4 chronic kidney disease, or unspecified chronic kidney disease: Secondary | ICD-10-CM | POA: Diagnosis not present

## 2016-05-25 DIAGNOSIS — Z23 Encounter for immunization: Secondary | ICD-10-CM | POA: Diagnosis not present

## 2016-06-03 ENCOUNTER — Encounter (HOSPITAL_COMMUNITY): Payer: Self-pay | Admitting: Emergency Medicine

## 2016-06-03 DIAGNOSIS — Z823 Family history of stroke: Secondary | ICD-10-CM | POA: Diagnosis not present

## 2016-06-03 DIAGNOSIS — I10 Essential (primary) hypertension: Secondary | ICD-10-CM | POA: Insufficient documentation

## 2016-06-03 DIAGNOSIS — G459 Transient cerebral ischemic attack, unspecified: Principal | ICD-10-CM | POA: Insufficient documentation

## 2016-06-03 DIAGNOSIS — R531 Weakness: Secondary | ICD-10-CM | POA: Diagnosis present

## 2016-06-03 DIAGNOSIS — I6782 Cerebral ischemia: Secondary | ICD-10-CM | POA: Diagnosis not present

## 2016-06-03 DIAGNOSIS — R4789 Other speech disturbances: Secondary | ICD-10-CM | POA: Diagnosis not present

## 2016-06-03 DIAGNOSIS — R54 Age-related physical debility: Secondary | ICD-10-CM | POA: Insufficient documentation

## 2016-06-03 DIAGNOSIS — Z79899 Other long term (current) drug therapy: Secondary | ICD-10-CM | POA: Diagnosis not present

## 2016-06-03 LAB — CBC
HCT: 40.7 % (ref 36.0–46.0)
Hemoglobin: 13.4 g/dL (ref 12.0–15.0)
MCH: 27.9 pg (ref 26.0–34.0)
MCHC: 32.9 g/dL (ref 30.0–36.0)
MCV: 84.6 fL (ref 78.0–100.0)
PLATELETS: 138 10*3/uL — AB (ref 150–400)
RBC: 4.81 MIL/uL (ref 3.87–5.11)
RDW: 13.7 % (ref 11.5–15.5)
WBC: 5.6 10*3/uL (ref 4.0–10.5)

## 2016-06-03 LAB — URINALYSIS, ROUTINE W REFLEX MICROSCOPIC
Bilirubin Urine: NEGATIVE
Glucose, UA: NEGATIVE mg/dL
Hgb urine dipstick: NEGATIVE
Ketones, ur: NEGATIVE mg/dL
Nitrite: NEGATIVE
PH: 5.5 (ref 5.0–8.0)
Protein, ur: NEGATIVE mg/dL
SPECIFIC GRAVITY, URINE: 1.026 (ref 1.005–1.030)

## 2016-06-03 LAB — BASIC METABOLIC PANEL
Anion gap: 7 (ref 5–15)
BUN: 14 mg/dL (ref 6–20)
CALCIUM: 9.4 mg/dL (ref 8.9–10.3)
CHLORIDE: 107 mmol/L (ref 101–111)
CO2: 24 mmol/L (ref 22–32)
CREATININE: 0.89 mg/dL (ref 0.44–1.00)
GFR calc non Af Amer: >60 mL/min (ref >60)
GLUCOSE: 106 mg/dL — AB (ref 65–99)
Potassium: 3.6 mmol/L (ref 3.5–5.1)
Sodium: 138 mmol/L (ref 135–145)

## 2016-06-03 LAB — URINE MICROSCOPIC-ADD ON

## 2016-06-03 NOTE — ED Notes (Signed)
Pt. reports brief left side body weakness while taking a shower at 10 am this morning , alert and oriented at arrival , speech clear/no facial; asymmetry , equal grips with no arm drift , ambulatory . Denies pain / respirations unlabored.

## 2016-06-04 ENCOUNTER — Observation Stay (HOSPITAL_BASED_OUTPATIENT_CLINIC_OR_DEPARTMENT_OTHER): Payer: Medicare Other

## 2016-06-04 ENCOUNTER — Observation Stay (HOSPITAL_COMMUNITY): Payer: Medicare Other

## 2016-06-04 ENCOUNTER — Encounter (HOSPITAL_COMMUNITY): Payer: Self-pay | Admitting: Internal Medicine

## 2016-06-04 ENCOUNTER — Other Ambulatory Visit (HOSPITAL_COMMUNITY): Payer: Medicare Other

## 2016-06-04 ENCOUNTER — Observation Stay (HOSPITAL_COMMUNITY)
Admission: EM | Admit: 2016-06-04 | Discharge: 2016-06-04 | Disposition: A | Discharge status: Home / Self Care | Payer: Medicare Other | Attending: Internal Medicine | Admitting: Internal Medicine

## 2016-06-04 ENCOUNTER — Emergency Department (HOSPITAL_COMMUNITY): Payer: Medicare Other

## 2016-06-04 DIAGNOSIS — I10 Essential (primary) hypertension: Secondary | ICD-10-CM

## 2016-06-04 DIAGNOSIS — R011 Cardiac murmur, unspecified: Secondary | ICD-10-CM | POA: Diagnosis not present

## 2016-06-04 DIAGNOSIS — I6782 Cerebral ischemia: Secondary | ICD-10-CM | POA: Diagnosis not present

## 2016-06-04 DIAGNOSIS — G459 Transient cerebral ischemic attack, unspecified: Secondary | ICD-10-CM

## 2016-06-04 DIAGNOSIS — G458 Other transient cerebral ischemic attacks and related syndromes: Secondary | ICD-10-CM

## 2016-06-04 HISTORY — DX: Essential (primary) hypertension: I10

## 2016-06-04 LAB — RAPID URINE DRUG SCREEN, HOSP PERFORMED
AMPHETAMINES: NOT DETECTED
BARBITURATES: NOT DETECTED
BENZODIAZEPINES: NOT DETECTED
COCAINE: NOT DETECTED
Opiates: NOT DETECTED
TETRAHYDROCANNABINOL: NOT DETECTED

## 2016-06-04 LAB — ECHOCARDIOGRAM COMPLETE
E decel time: 254 msec
FS: 28 % (ref 28–44)
Height: 60 in
IV/PV OW: 0.92
LA diam end sys: 32 mm
LA diam index: 2.06 cm/m2
LASIZE: 32 mm
LAVOL: 39.8 mL
LAVOLA4C: 36.6 mL
LAVOLIN: 25.7 mL/m2
LV TDI E'MEDIAL: 7.18
LV e' LATERAL: 6.96 cm/s
MV Dec: 254
MVPKEVEL: 1 m/s
PW: 13 mm — AB (ref 0.6–1.1)
Reg peak vel: 267 cm/s
TDI e' lateral: 6.96
TRMAXVEL: 267 cm/s
Weight: 2067.2 oz

## 2016-06-04 LAB — I-STAT CHEM 8, ED
BUN: 17 mg/dL (ref 6–20)
CALCIUM ION: 1.22 mmol/L (ref 1.13–1.30)
CHLORIDE: 105 mmol/L (ref 101–111)
CREATININE: 0.7 mg/dL (ref 0.44–1.00)
GLUCOSE: 88 mg/dL (ref 65–99)
HCT: 45 % (ref 36.0–46.0)
Hemoglobin: 15.3 g/dL — ABNORMAL HIGH (ref 12.0–15.0)
Potassium: 3.9 mmol/L (ref 3.5–5.1)
Sodium: 142 mmol/L (ref 135–145)
TCO2: 28 mmol/L (ref 0–100)

## 2016-06-04 LAB — PROTIME-INR
INR: 1.16 (ref 0.00–1.49)
Prothrombin Time: 15 seconds (ref 11.6–15.2)

## 2016-06-04 LAB — APTT: aPTT: 33 seconds (ref 24–37)

## 2016-06-04 LAB — MRSA PCR SCREENING: MRSA by PCR: NEGATIVE

## 2016-06-04 LAB — LIPID PANEL
Cholesterol: 191 mg/dL (ref 0–200)
HDL: 63 mg/dL (ref >40)
LDL CALC: 111 mg/dL — AB (ref 0–99)
TRIGLYCERIDES: 85 mg/dL (ref <150)
Total CHOL/HDL Ratio: 3 RATIO
VLDL: 17 mg/dL (ref 0–40)

## 2016-06-04 LAB — I-STAT TROPONIN, ED: TROPONIN I, POC: 0 ng/mL (ref 0.00–0.08)

## 2016-06-04 LAB — GLUCOSE, CAPILLARY: GLUCOSE-CAPILLARY: 86 mg/dL (ref 65–99)

## 2016-06-04 MED ORDER — CO Q 10 10 MG PO CAPS: 1.0000 | ORAL_CAPSULE | Freq: Every day | ORAL | Status: DC

## 2016-06-04 MED ORDER — ATORVASTATIN CALCIUM 20 MG PO TABS: 20.0000 mg | ORAL_TABLET | Freq: Every day | ORAL | Status: DC

## 2016-06-04 MED ORDER — OMEGA-3-ACID ETHYL ESTERS 1 G PO CAPS
1.0000 g | ORAL_CAPSULE | Freq: Every day | ORAL | Status: DC
  Administered 2016-06-04: 1 g via ORAL
  Filled 2016-06-04: qty 1

## 2016-06-04 MED ORDER — HYDRALAZINE HCL 20 MG/ML IJ SOLN: 5.0000 mg | INTRAMUSCULAR | Status: DC | PRN

## 2016-06-04 MED ORDER — ASPIRIN 325 MG PO TABS: 325.0000 mg | ORAL_TABLET | Freq: Every day | ORAL | Status: DC

## 2016-06-04 MED ORDER — SENNOSIDES-DOCUSATE SODIUM 8.6-50 MG PO TABS
1.0000 | ORAL_TABLET | Freq: Every evening | ORAL | Status: DC | PRN
  Filled 2016-06-04: qty 1

## 2016-06-04 MED ORDER — ASPIRIN 325 MG PO TABS
325.0000 mg | ORAL_TABLET | Freq: Every day | ORAL | Status: DC
  Administered 2016-06-04: 325 mg via ORAL
  Filled 2016-06-04: qty 1

## 2016-06-04 MED ORDER — ENOXAPARIN SODIUM 40 MG/0.4ML ~~LOC~~ SOLN
40.0000 mg | SUBCUTANEOUS | Status: DC
  Administered 2016-06-04: 40 mg via SUBCUTANEOUS
  Filled 2016-06-04: qty 0.4

## 2016-06-04 MED ORDER — VITAMIN D 1000 UNITS PO TABS
2000.0000 [IU] | ORAL_TABLET | Freq: Every day | ORAL | Status: DC
  Administered 2016-06-04: 2000 [IU] via ORAL
  Filled 2016-06-04: qty 2

## 2016-06-04 MED ORDER — OMEPRAZOLE 40 MG PO CPDR: 40.0000 mg | DELAYED_RELEASE_CAPSULE | Freq: Every day | ORAL | Status: DC

## 2016-06-04 MED ORDER — IRBESARTAN 300 MG PO TABS
300.0000 mg | ORAL_TABLET | Freq: Every day | ORAL | Status: DC
  Administered 2016-06-04: 300 mg via ORAL
  Filled 2016-06-04: qty 1

## 2016-06-04 MED ORDER — FLUTICASONE PROPIONATE 50 MCG/ACT NA SUSP
1.0000 | Freq: Every day | NASAL | Status: DC | PRN
  Filled 2016-06-04: qty 16

## 2016-06-04 MED ORDER — GINKGO BILOBA 40 MG PO CAPS: 2.0000 | ORAL_CAPSULE | Freq: Every day | ORAL | Status: DC

## 2016-06-04 MED ORDER — STROKE: EARLY STAGES OF RECOVERY BOOK
Freq: Once | Status: AC
  Administered 2016-06-04: 05:00:00
  Filled 2016-06-04: qty 1

## 2016-06-04 MED ORDER — ASPIRIN 300 MG RE SUPP: 300.0000 mg | Freq: Every day | RECTAL | Status: DC

## 2016-06-04 MED ORDER — ACETAMINOPHEN 325 MG PO TABS
650.0000 mg | ORAL_TABLET | Freq: Four times a day (QID) | ORAL | Status: DC | PRN
  Administered 2016-06-04: 650 mg via ORAL
  Filled 2016-06-04: qty 2

## 2016-06-04 NOTE — Progress Notes (Signed)
Preliminary results by tech - Carotid Duplex Completed. No evidence of a significant stenosis noted in bilateral carotid arteries -1-39%. Vertebral arteries demonstrate antegrade flow. Oda Cogan, BS, RDMS, RVT

## 2016-06-04 NOTE — ED Provider Notes (Signed)
Patient seen/examined in the Emergency Department in conjunction with Midlevel Provider Carlota Raspberry Patient reports left sided weakness earlier, now at baseline Exam : awake/alert, no facial droop, no arm/leg drift Plan: admit for TIA Pt agreeable with plan    Ripley Fraise, MD 06/04/16 [phone]

## 2016-06-04 NOTE — Care Management Obs Status (Signed)
[place] NOTIFICATION   Patient Details  Name: Sreeya Rossiter MRN: [id number] Date of Birth: 1946-06-20   Medicare Observation Status Notification Given:  Yes (MRI negative)    Pollie Friar, RN 06/04/2016, 1:01 PM

## 2016-06-04 NOTE — ED Provider Notes (Signed)
CSN: [id number]     Arrival date & time 06/03/16  1913 History   First MD Initiated Contact with Patient 06/04/16 0024     Chief Complaint  Patient presents with  . Weakness     (Consider location/radiation/quality/duration/timing/severity/associated sxs/prior Treatment) HPI   This is a 70 year old Caucasian female with a history of hypertension who presents with two episodes of weakness and heaviness in her left arm and left foot yesterday. Patient states she was in the shower around 10:30 am when she began to feel a weakness/heaviness in her left arm and had associated dysphagia. She then began feeling the same heaviness in her left foot. She states she was able to get out of the shower and sit down, at which time she took her blood pressure which was 145/70 which she states is high for her. The episode resolved spontaneously after approximately 30 minutes. Later in the day around 6 pm, the heaviness in her left foot returned at which time she decided to come to the emergency room. She reports feeling some "heaviness" in her tongue during these episodes as well which made it difficult for her to speak, but she denies vision changes or facial droop. She has never had anything like this before. Currently asymptomatic  ROS: The patient denies diaphoresis, fever, headache, weakness , confusion, change of vision,  dysphagia, aphagia, shortness of breath,  abdominal pains, nausea, vomiting, diarrhea, lower extremity swelling, rash, neck pain, chest pain  Past Medical History  Diagnosis Date  . Heart murmur     not treated..  . Headache   . Hypertension    Past Surgical History  Procedure Laterality Date  . Cholecystectomy    . Wrist surgery      left thumb (pin)  . Urinary surgery      mesh   . Hysteroscopy w/d&c N/A 12/29/2015    Procedure: DILATATION AND CURETTAGE /HYSTEROSCOPY;  Surgeon: Janyth Pupa, DO;  Location: [facility];  Service: Gynecology;  Laterality: N/A;   No family history on  file. Social History  Substance Use Topics  . Smoking status: Never Smoker   . Smokeless tobacco: Never Used  . Alcohol Use: No   OB History    No data available     Review of Systems  Review of Systems All other systems negative except as documented in the HPI. All pertinent positives and negatives as reviewed in the HPI.   Allergies  Review of patient's allergies indicates no known allergies.  Home Medications   Prior to Admission medications   Medication Sig Start Date End Date Taking? Authorizing Provider  Cholecalciferol (VITAMIN D) 2000 units CAPS Take 2,000 Units by mouth daily.   Yes Historical Provider, MD  Coenzyme Q10 (CO Q 10 PO) Take 1 tablet by mouth daily.   Yes Historical Provider, MD  fluticasone (FLONASE) 50 MCG/ACT nasal spray Place 1 spray into both nostrils daily as needed. 11/13/15  Yes Historical Provider, MD  GINKGO BILOBA PO Take 2 capsules by mouth daily.    Yes Historical Provider, MD  Omega-3 Fatty Acids (FISH OIL PO) Take 1 capsule by mouth daily.   Yes Historical Provider, MD  telmisartan (MICARDIS) 80 MG tablet Take 80 mg by mouth daily. 11/04/15  Yes Historical Provider, MD   BP 173/88 mmHg  Pulse 57  Temp(Src) 99 F (37.2 C) (Oral)  Resp 16  Ht 5\' 1"  (1.549 m)  Wt 58.514 kg  BMI 24.39 kg/m2  SpO2 99% Physical Exam  Constitutional: She appears well-developed and well-nourished. No distress.  HENT:  Head: Normocephalic and atraumatic.  Right Ear: Tympanic membrane and ear canal normal.  Left Ear: Tympanic membrane and ear canal normal.  Nose: Nose normal.  Mouth/Throat: Uvula is midline, oropharynx is clear and moist and mucous membranes are normal.  Eyes: Pupils are equal, round, and reactive to light.  Neck: Normal range of motion. Neck supple.  Cardiovascular: Normal rate and regular rhythm.   Pulmonary/Chest: Effort normal.  Abdominal: Soft.  No signs of abdominal distention  Musculoskeletal:  No LE swelling  Neurological: She  is alert.  Acting at baseline Cranial nerves grossly intact on exam. Pt alert and oriented x 3 Upper and lower extremity strength is symmetrical and physiologic Normal muscular tone No facial droop Coordination intact, no limb ataxia,No pronator drift  Skin: Skin is warm and dry. No rash noted.  Nursing note and vitals reviewed.   ED Course  Procedures (including critical care time) Labs Review Labs Reviewed  BASIC METABOLIC PANEL - Abnormal; Notable for the following:    Glucose, Bld 106 (*)    All other components within normal limits  CBC - Abnormal; Notable for the following:    Platelets 138 (*)    All other components within normal limits  URINALYSIS, ROUTINE W REFLEX MICROSCOPIC (NOT AT [facility]) - Abnormal; Notable for the following:    Leukocytes, UA SMALL (*)    All other components within normal limits  URINE MICROSCOPIC-ADD ON - Abnormal; Notable for the following:    Squamous Epithelial / LPF 0-5 (*)    Bacteria, UA RARE (*)    All other components within normal limits  I-STAT CHEM 8, ED - Abnormal; Notable for the following:    Hemoglobin 15.3 (*)    All other components within normal limits  PROTIME-INR  APTT  I-STAT TROPOININ, ED    Imaging Review Ct Head Wo Contrast  06/04/2016  CLINICAL DATA:  Acute onset of generalized weakness. Initial encounter. EXAM: CT HEAD WITHOUT CONTRAST TECHNIQUE: Contiguous axial images were obtained from the base of the skull through the vertex without intravenous contrast. COMPARISON:  None. FINDINGS: There is no evidence of acute infarction, mass lesion, or intra- or extra-axial hemorrhage on CT. Mild periventricular and subcortical white matter change likely reflects small vessel ischemic microangiopathy. The posterior fossa, including the cerebellum, brainstem and fourth ventricle, is within normal limits. The third and lateral ventricles, and basal ganglia are unremarkable in appearance. The cerebral hemispheres are symmetric in  appearance, with normal gray-white differentiation. No mass effect or midline shift is seen. There is no evidence of fracture; visualized osseous structures are unremarkable in appearance. The visualized portions of the orbits are within normal limits. The paranasal sinuses and mastoid air cells are well-aerated. No significant soft tissue abnormalities are seen. IMPRESSION: 1. No acute intracranial pathology seen on CT. 2. Mild small vessel ischemic microangiopathy. Electronically Signed   By: Garald Balding M.D.   On: 06/04/2016 03:45   I have personally reviewed and evaluated these images and lab results as part of my medical decision-making.   EKG Interpretation   Date/Time:  Thursday June 03 2016 19:27:28 EDT Ventricular Rate:  69 PR Interval:  112 QRS Duration: 74 QT Interval:  412 QTC Calculation: 441 R Axis:   68 Text Interpretation:  Normal sinus rhythm Normal ECG no change from prior  Confirmed by Christy Gentles  MD, [place] ([id number]) on 06/04/2016 12:28:31 AM      MDM  Final diagnoses:  Transient cerebral ischemia, unspecified transient cerebral ischemia type   Negative head CT, pt currently asymptomatic. Story is strongly concerning for TIA. Will admit to [facility] for TIA work-up.  Patient seen by Dr. Christy Gentles as well who agrees with treatment and plan. Pt made aware of concern for TIA and plan for admission and is agreeable.  Admit, obs, [facility], [facility]mit, Dr. Blaine Hamper, ,    Delos Haring, PA-C 06/04/16 [place], MD 06/04/16 [phone]

## 2016-06-04 NOTE — Progress Notes (Signed)
*  PRELIMINARY RESULTS* Echocardiogram 2D Echocardiogram has been performed.  Leavy Cella 06/04/2016, 2:37 PM

## 2016-06-04 NOTE — Progress Notes (Signed)
D/C orders received, pt for D/C home today.  IV and telemetry D/C.  Rx and D/C instructions given with verbalized understanding.  Family at bedside to assist with D/C.  Staff brought pt downstairs via wheelchair.  

## 2016-06-04 NOTE — H&P (Signed)
History and Physical    Terri Turner [id number] DOB: [birth year] DOA: 06/04/2016  Referring MD/NP/PA:   PCP: No primary care provider on file.   Patient coming from:  The patient is coming from home.  At baseline, pt is independent for most of ADL.      Chief Complaint: Left-sided weakness and difficulty speaking  HPI: Terri Turner is a 70 y.o. female with medical history significant of hypertension, headache, who presents with left-sided weakness and difficulty speaking.  Patient reports that she had 2 episode of left-sided weakness and difficulty speaking. First episode happened at about 10 AM. She had weakness in left arm, left foot and lower leg. She also had difficulty speaking, feeding like difficulty using her tongue. It lasted for about 30 minutes, then resolved spontaneously. She had had another similar episode at about 6 PM, which lasted for about 15-20 minutes, then resolved spontaneously. She did not have vision change or hearing loss. No tingling sensations in her extremities. Patient did not have headache, chest pain, dizziness. Patient denies nausea, vomiting, abdominal pain, diarrhea or symptoms of a UTI. No fever or chills.  ED Course: pt was found to have WBC 5.6, negative troponin, temperature normal, bradycardia, electrolytes and renal function okay. Negative CT head for acute intracranial abnormalities.  Review of Systems:   General: no fevers, chills, no changes in body weight, has fatigue HEENT: no blurry vision, hearing changes or sore throat Pulm: no dyspnea, coughing, wheezing CV: no chest pain, no palpitations Abd: no nausea, vomiting, abdominal pain, diarrhea, constipation GU: no dysuria, burning on urination, increased urinary frequency, hematuria  Ext: no leg edema Neuro: had left sided weakness and difficult speaking. No vision change or hearing loss Skin: no rash MSK: No muscle spasm, no deformity, no limitation of range of movement in spin Heme: No  easy bruising.  Travel history: No recent long distant travel.  Allergy: No Known Allergies  Past Medical History  Diagnosis Date  . Heart murmur     not treated..  . Headache   . Hypertension     Past Surgical History  Procedure Laterality Date  . Cholecystectomy    . Wrist surgery      left thumb (pin)  . Urinary surgery      mesh   . Hysteroscopy w/d&c N/A 12/29/2015    Procedure: DILATATION AND CURETTAGE /HYSTEROSCOPY;  Surgeon: Janyth Pupa, DO;  Location: [facility];  Service: Gynecology;  Laterality: N/A;    Social History:  reports that she has never smoked. She has never used smokeless tobacco. She reports that she does not drink alcohol or use illicit drugs.  Family History:  Family History  Problem Relation Age of Onset  . Hypertension Mother   . Stroke Father   . Prostate cancer Brother   . Hypertension Sister   . Alzheimer's disease Sister      Prior to Admission medications   Medication Sig Start Date End Date Taking? Authorizing Provider  Cholecalciferol (VITAMIN D) 2000 units CAPS Take 2,000 Units by mouth daily.   Yes Historical Provider, MD  Coenzyme Q10 (CO Q 10 PO) Take 1 tablet by mouth daily.   Yes Historical Provider, MD  fluticasone (FLONASE) 50 MCG/ACT nasal spray Place 1 spray into both nostrils daily as needed. 11/13/15  Yes Historical Provider, MD  GINKGO BILOBA PO Take 2 capsules by mouth daily.    Yes Historical Provider, MD  Omega-3 Fatty Acids (FISH OIL PO) Take 1 capsule by mouth daily.  Yes Historical Provider, MD  telmisartan (MICARDIS) 80 MG tablet Take 80 mg by mouth daily. 11/04/15  Yes Historical Provider, MD    Physical Exam: Filed Vitals:   06/04/16 0300 06/04/16 0315 06/04/16 0342 06/04/16 0344  BP: 154/83 155/82 173/88   Pulse: 63 56 57   Temp:    99 F (37.2 C)  TempSrc:      Resp: 19 16 16    Height:      Weight:      SpO2: 100% 97% 99%    General: Not in acute distress HEENT:       Eyes: PERRL, EOMI, no scleral  icterus.       ENT: No discharge from the ears and nose, no pharynx injection, no tonsillar enlargement.        Neck: No JVD, no bruit, no mass felt. Heme: No neck lymph node enlargement. Cardiac: S1/S2, RRR, No murmurs, No gallops or rubs. Pulm: No rales, wheezing, rhonchi or rubs. Abd: Soft, nondistended, nontender, no rebound pain, no organomegaly, BS present. GU: No hematuria Ext: No pitting leg edema bilaterally. 2+DP/PT pulse bilaterally. Musculoskeletal: No joint deformities, No joint redness or warmth, no limitation of ROM in spin. Skin: No rashes.  Neuro: Alert, oriented X3, cranial nerves II-XII grossly intact, moves all extremities normally. Muscle strength 5/5 in all extremities, sensation to light touch intact. Knee reflex 1+ bilaterally. Negative Babinski's sign. Normal finger to nose test. Psych: Patient is not psychotic, no suicidal or hemocidal ideation.  Labs on Admission: I have personally reviewed following labs and imaging studies  CBC:  Recent Labs Lab 06/03/16 1936 06/04/16 0354  WBC 5.6  --   HGB 13.4 15.3*  HCT 40.7 45.0  MCV 84.6  --   PLT 138*  --    Basic Metabolic Panel:  Recent Labs Lab 06/03/16 1936 06/04/16 0354  NA 138 142  K 3.6 3.9  CL 107 105  CO2 24  --   GLUCOSE 106* 88  BUN 14 17  CREATININE 0.89 0.70  CALCIUM 9.4  --    GFR: Estimated Creatinine Clearance: 53 mL/min (by C-G formula based on Cr of 0.7). Liver Function Tests: No results for input(s): AST, ALT, ALKPHOS, BILITOT, PROT, ALBUMIN in the last 168 hours. No results for input(s): LIPASE, AMYLASE in the last 168 hours. No results for input(s): AMMONIA in the last 168 hours. Coagulation Profile: No results for input(s): INR, PROTIME in the last 168 hours. Cardiac Enzymes: No results for input(s): CKTOTAL, CKMB, CKMBINDEX, TROPONINI in the last 168 hours. BNP (last 3 results) No results for input(s): PROBNP in the last 8760 hours. HbA1C: No results for input(s):  HGBA1C in the last 72 hours. CBG: No results for input(s): GLUCAP in the last 168 hours. Lipid Profile: No results for input(s): CHOL, HDL, LDLCALC, TRIG, CHOLHDL, LDLDIRECT in the last 72 hours. Thyroid Function Tests: No results for input(s): TSH, T4TOTAL, FREET4, T3FREE, THYROIDAB in the last 72 hours. Anemia Panel: No results for input(s): VITAMINB12, FOLATE, FERRITIN, TIBC, IRON, RETICCTPCT in the last 72 hours. Urine analysis:    Component Value Date/Time   COLORURINE YELLOW 06/03/2016 [place] 06/03/2016 1835   LABSPEC 1.026 06/03/2016 1835   PHURINE 5.5 06/03/2016 [place] NEGATIVE 06/03/2016 1835   HGBUR NEGATIVE 06/03/2016 [place] NEGATIVE 06/03/2016 [place] 06/03/2016 1835   PROTEINUR NEGATIVE 06/03/2016 1835   NITRITE NEGATIVE 06/03/2016 1835   LEUKOCYTESUR SMALL* 06/03/2016 1835  Sepsis Labs: @LABRCNTIP (procalcitonin:4,lacticidven:4) )No results found for this or any previous visit (from the past 240 hour(s)).   Radiological Exams on Admission: Ct Head Wo Contrast  06/04/2016  CLINICAL DATA:  Acute onset of generalized weakness. Initial encounter. EXAM: CT HEAD WITHOUT CONTRAST TECHNIQUE: Contiguous axial images were obtained from the base of the skull through the vertex without intravenous contrast. COMPARISON:  None. FINDINGS: There is no evidence of acute infarction, mass lesion, or intra- or extra-axial hemorrhage on CT. Mild periventricular and subcortical white matter change likely reflects small vessel ischemic microangiopathy. The posterior fossa, including the cerebellum, brainstem and fourth ventricle, is within normal limits. The third and lateral ventricles, and basal ganglia are unremarkable in appearance. The cerebral hemispheres are symmetric in appearance, with normal gray-white differentiation. No mass effect or midline shift is seen. There is no evidence of fracture; visualized osseous structures are  unremarkable in appearance. The visualized portions of the orbits are within normal limits. The paranasal sinuses and mastoid air cells are well-aerated. No significant soft tissue abnormalities are seen. IMPRESSION: 1. No acute intracranial pathology seen on CT. 2. Mild small vessel ischemic microangiopathy. Electronically Signed   By: Garald Balding M.D.   On: 06/04/2016 03:45     EKG: Independently reviewed. Sinus rhythm, QTC 441, early R-wave progression, no ischemic change.  Assessment/Plan Principal Problem:   TIA (transient ischemic attack) Active Problems:   Hypertension   TIA (transient ischemic attack): pt's symptoms are concerning for TIA. Her risk factors include old age, hypertension, and family history of stroke. Currently patient is asymptomatic.  - will place on tele bed for obs -Atrial fibrillation: not present  -tPA was not given because symptoms has resolved completely, and also are out of window for tPA - Risk factor modification: HgbA1c, fasting lipid panel - MRI, MRA of the brain without contrast  - PT consult, OT consult - Bedside swallowing screen was ordered, will get speech consult in AM - 2 d Echocardiogram  - Ekg was done - Carotid dopplers  - start Aspirin - check UDS  Hypertension: bp is 173/88. -Irbesartan -Hydralazine when necessary  DVT ppx:  SQ Lovenox Code Status: Full code Family Communication: None at bed side.  Disposition Plan:  Anticipate discharge back to previous home environment Consults called:  none Admission status: Obs / tele       Date of Service 06/04/2016    Ivor Costa [facility] Pager [phone]  If 7PM-7AM, please contact night-coverage [email or web address] Password [email or web address] 06/04/2016, 5:00 AM

## 2016-06-04 NOTE — Evaluation (Signed)
Speech Language Pathology Evaluation Patient Details Name: Terri Turner MRN: [id number] DOB: 06/04/1946 Today's Date: 06/04/2016 Time: 0713-0723 SLP Time Calculation (min) (ACUTE ONLY): 10 min  Problem List:  Patient Active Problem List   Diagnosis Date Noted  . TIA (transient ischemic attack) 06/04/2016  . Heart murmur   . Hypertension   . Essential hypertension    Past Medical History:  Past Medical History  Diagnosis Date  . Heart murmur     not treated..  . Headache   . Hypertension    Past Surgical History:  Past Surgical History  Procedure Laterality Date  . Cholecystectomy    . Wrist surgery      left thumb (pin)  . Urinary surgery      mesh   . Hysteroscopy w/d&c N/A 12/29/2015    Procedure: DILATATION AND CURETTAGE /HYSTEROSCOPY;  Surgeon: Janyth Pupa, DO;  Location: [facility];  Service: Gynecology;  Laterality: N/A;   HPI:  70 y.o. female with medical history significant of hypertension, headache, who presents with left-sided weakness and difficulty speaking. Negative CT head for acute intracranial abnormalities. MRI pending.   Assessment / Plan / Recommendation Clinical Impression  Pt administered selected subtests of the Cognistat. Time of event pt stated she experienced garbled speech, no confusion. Presently speech is 100% intelligible in conversation, no language or cognitive disturbance. Pt in agreement with no ST services needed.     SLP Assessment  Patient does not need any further Speech Lanaguage Pathology Services    Follow Up Recommendations  None    Frequency and Duration           SLP Evaluation Prior Functioning  Cognitive/Linguistic Baseline: Within functional limits Type of Home: Apartment  Lives With: Spouse Vocation: Retired   Associate Professor  Overall Cognitive Status: Within Functional Limits for tasks assessed Orientation Level: Oriented X4    Comprehension  Auditory Comprehension Overall Auditory Comprehension: Appears within  functional limits for tasks assessed Visual Recognition/Discrimination Discrimination: Not tested Reading Comprehension Reading Status: Not tested    Expression Expression Primary Mode of Expression: Verbal Verbal Expression Overall Verbal Expression: Appears within functional limits for tasks assessed Written Expression Dominant Hand: Right Written Expression: Not tested   Oral / Motor  Oral Motor/Sensory Function Overall Oral Motor/Sensory Function: Within functional limits Motor Speech Overall Motor Speech: Appears within functional limits for tasks assessed   GO          Functional Assessment Tool Used: skilled clinical judgement Functional Limitations: Motor speech Motor Speech Current Status [phone]): 0 percent impaired, limited or restricted Motor Speech Goal Status [phone]): 0 percent impaired, limited or restricted Motor Speech Goal Status [id number]): 0 percent impaired, limited or restricted         Terri Turner 06/04/2016, 7:29 AM  Orbie Pyo Colvin Caroli.Ed [employer] [phone]

## 2016-06-04 NOTE — Discharge Summary (Signed)
Physician Discharge Summary  Braeleigh Pyper MRN: [id number] DOB/AGE: 08/24/1946 70 y.o.  PCP: No primary care provider on file.   Admit date: 06/04/2016 Discharge date: 06/04/2016  Discharge Diagnoses:     Principal Problem:   TIA (transient ischemic attack) Active Problems:   Hypertension    Follow-up recommendations Follow-up with PCP in 3-5 days , including all  additional recommended appointments as below Follow-up CBC, CMP in 3-5 days PCP-Please follow-up on the results of hemoglobin A1c      Current Discharge Medication List    START taking these medications   Details  aspirin 325 MG tablet Take 1 tablet (325 mg total) by mouth daily. Qty: 30 tablet, Refills: 1    atorvastatin (LIPITOR) 20 MG tablet Take 1 tablet (20 mg total) by mouth daily. Qty: 30 tablet, Refills: 1    omeprazole (PRILOSEC) 40 MG capsule Take 1 capsule (40 mg total) by mouth daily. Qty: 30 capsule, Refills: 1      CONTINUE these medications which have NOT CHANGED   Details  Cholecalciferol (VITAMIN D) 2000 units CAPS Take 2,000 Units by mouth daily.    Coenzyme Q10 (CO Q 10 PO) Take 1 tablet by mouth daily.    fluticasone (FLONASE) 50 MCG/ACT nasal spray Place 1 spray into both nostrils daily as needed. Refills: 1    GINKGO BILOBA PO Take 2 capsules by mouth daily.     Omega-3 Fatty Acids (FISH OIL PO) Take 1 capsule by mouth daily.    telmisartan (MICARDIS) 80 MG tablet Take 80 mg by mouth daily.         Discharge Condition: Stable   Discharge Instructions Get Medicines reviewed and adjusted: Please take all your medications with you for your next visit with your Primary MD  Please request your Primary MD to go over all hospital tests and procedure/radiological results at the follow up, please ask your Primary MD to get all Hospital records sent to his/her office.  If you experience worsening of your admission symptoms, develop shortness of breath, life threatening  emergency, suicidal or homicidal thoughts you must seek medical attention immediately by calling 911 or calling your MD immediately if symptoms less severe.  You must read complete instructions/literature along with all the possible adverse reactions/side effects for all the Medicines you take and that have been prescribed to you. Take any new Medicines after you have completely understood and accpet all the possible adverse reactions/side effects.   Do not drive when taking Pain medications.   Do not take more than prescribed Pain, Sleep and Anxiety Medications  Special Instructions: If you have smoked or chewed Tobacco in the last 2 yrs please stop smoking, stop any regular Alcohol and or any Recreational drug use.  Wear Seat belts while driving.  Please note  You were cared for by a hospitalist during your hospital stay. Once you are discharged, your primary care physician will handle any further medical issues. Please note that NO REFILLS for any discharge medications will be authorized once you are discharged, as it is imperative that you return to your primary care physician (or establish a relationship with a primary care physician if you do not have one) for your aftercare needs so that they can reassess your need for medications and monitor your lab values.     No Known Allergies    Disposition: 01-Home or Self Care   Consults: None     Significant Diagnostic Studies:  Ct Head Wo Contrast  06/04/2016  CLINICAL DATA:  Acute onset of generalized weakness. Initial encounter. EXAM: CT HEAD WITHOUT CONTRAST TECHNIQUE: Contiguous axial images were obtained from the base of the skull through the vertex without intravenous contrast. COMPARISON:  None. FINDINGS: There is no evidence of acute infarction, mass lesion, or intra- or extra-axial hemorrhage on CT. Mild periventricular and subcortical white matter change likely reflects small vessel ischemic microangiopathy. The  posterior fossa, including the cerebellum, brainstem and fourth ventricle, is within normal limits. The third and lateral ventricles, and basal ganglia are unremarkable in appearance. The cerebral hemispheres are symmetric in appearance, with normal gray-white differentiation. No mass effect or midline shift is seen. There is no evidence of fracture; visualized osseous structures are unremarkable in appearance. The visualized portions of the orbits are within normal limits. The paranasal sinuses and mastoid air cells are well-aerated. No significant soft tissue abnormalities are seen. IMPRESSION: 1. No acute intracranial pathology seen on CT. 2. Mild small vessel ischemic microangiopathy. Electronically Signed   By: Garald Balding M.D.   On: 06/04/2016 03:45   Mr Brain Wo Contrast  06/04/2016  CLINICAL DATA:  TIA.  Left-sided weakness and speech difficulty EXAM: MRI HEAD WITHOUT CONTRAST MRA HEAD WITHOUT CONTRAST TECHNIQUE: Multiplanar, multiecho pulse sequences of the brain and surrounding structures were obtained without intravenous contrast. Angiographic images of the head were obtained using MRA technique without contrast. COMPARISON:  CT head 06/04/2016 FINDINGS: MRI HEAD FINDINGS Negative for acute infarct Multiple small hyperintensities throughout the cerebral white matter bilaterally consistent with chronic microvascular ischemia. Brainstem and cerebellum normal. Ventricle size normal.  Cerebral volume normal. Negative for hemorrhage or fluid collection. Negative for mass or adenopathy.  No shift of the midline structures Paranasal sinuses clear.  Normal orbital contents. Pituitary and skull base normal. MRA HEAD FINDINGS Both vertebral arteries patent to the basilar. PICA, superior cerebellar, posterior cerebral arteries patent bilaterally without stenosis. Basilar widely patent. Cavernous carotid widely patent bilaterally. Anterior and middle cerebral arteries widely patent bilaterally without  significant stenosis. Negative for cerebral aneurysm. IMPRESSION: Moderate chronic microvascular ischemic change in the white matter. No acute infarct Negative MRA circle of Willis. Electronically Signed   By: Franchot Gallo M.D.   On: 06/04/2016 08:53   Mr Jodene Nam Head/brain Wo Cm  06/04/2016  CLINICAL DATA:  TIA.  Left-sided weakness and speech difficulty EXAM: MRI HEAD WITHOUT CONTRAST MRA HEAD WITHOUT CONTRAST TECHNIQUE: Multiplanar, multiecho pulse sequences of the brain and surrounding structures were obtained without intravenous contrast. Angiographic images of the head were obtained using MRA technique without contrast. COMPARISON:  CT head 06/04/2016 FINDINGS: MRI HEAD FINDINGS Negative for acute infarct Multiple small hyperintensities throughout the cerebral white matter bilaterally consistent with chronic microvascular ischemia. Brainstem and cerebellum normal. Ventricle size normal.  Cerebral volume normal. Negative for hemorrhage or fluid collection. Negative for mass or adenopathy.  No shift of the midline structures Paranasal sinuses clear.  Normal orbital contents. Pituitary and skull base normal. MRA HEAD FINDINGS Both vertebral arteries patent to the basilar. PICA, superior cerebellar, posterior cerebral arteries patent bilaterally without stenosis. Basilar widely patent. Cavernous carotid widely patent bilaterally. Anterior and middle cerebral arteries widely patent bilaterally without significant stenosis. Negative for cerebral aneurysm. IMPRESSION: Moderate chronic microvascular ischemic change in the white matter. No acute infarct Negative MRA circle of Willis. Electronically Signed   By: Franchot Gallo M.D.   On: 06/04/2016 08:53    2-D echo-results pending    Filed Weights   06/03/16 1927 06/04/16 0523  Weight: 58.514  kg (129 lb) 58.605 kg (129 lb 3.2 oz)     Microbiology: Recent Results (from the past 240 hour(s))  MRSA PCR Screening     Status: None   Collection Time:  06/04/16  7:19 AM  Result Value Ref Range Status   MRSA by PCR NEGATIVE NEGATIVE Final    Comment:        The GeneXpert MRSA Assay (FDA approved for NASAL specimens only), is one component of a comprehensive MRSA colonization surveillance program. It is not intended to diagnose MRSA infection nor to guide or monitor treatment for MRSA infections.        Blood Culture No results found for: SDES, [place], CULT, REPTSTATUS    Labs: Results for orders placed or performed during the hospital encounter of 06/04/16 (from the past 48 hour(s))  Urinalysis, Routine w reflex microscopic     Status: Abnormal   Collection Time: 06/03/16  6:35 PM  Result Value Ref Range   Color, Urine YELLOW YELLOW   APPearance CLEAR CLEAR   Specific Gravity, Urine 1.026 1.005 - 1.030   pH 5.5 5.0 - 8.0   Glucose, UA NEGATIVE NEGATIVE mg/dL   Hgb urine dipstick NEGATIVE NEGATIVE   Bilirubin Urine NEGATIVE NEGATIVE   Ketones, ur NEGATIVE NEGATIVE mg/dL   Protein, ur NEGATIVE NEGATIVE mg/dL   Nitrite NEGATIVE NEGATIVE   Leukocytes, UA SMALL (A) NEGATIVE  Urine microscopic-add on     Status: Abnormal   Collection Time: 06/03/16  6:35 PM  Result Value Ref Range   Squamous Epithelial / LPF 0-5 (A) NONE SEEN   WBC, UA 0-5 0 - 5 WBC/hpf   RBC / HPF 0-5 0 - 5 RBC/hpf   Bacteria, UA RARE (A) NONE SEEN  Basic metabolic panel     Status: Abnormal   Collection Time: 06/03/16  7:36 PM  Result Value Ref Range   Sodium 138 135 - 145 mmol/L   Potassium 3.6 3.5 - 5.1 mmol/L   Chloride 107 101 - 111 mmol/L   CO2 24 22 - 32 mmol/L   Glucose, Bld 106 (H) 65 - 99 mg/dL   BUN 14 6 - 20 mg/dL   Creatinine, Ser 0.89 0.44 - 1.00 mg/dL   Calcium 9.4 8.9 - 10.3 mg/dL   GFR calc non Af Amer >60 >60 mL/min   GFR calc Af Amer >60 >60 mL/min    Comment: (NOTE) The eGFR has been calculated using the CKD EPI equation. This calculation has not been validated in all clinical situations. eGFR's persistently <60  mL/min signify possible Chronic Kidney Disease.    Anion gap 7 5 - 15  CBC     Status: Abnormal   Collection Time: 06/03/16  7:36 PM  Result Value Ref Range   WBC 5.6 4.0 - 10.5 K/uL   RBC 4.81 3.87 - 5.11 MIL/uL   Hemoglobin 13.4 12.0 - 15.0 g/dL   HCT 40.7 36.0 - 46.0 %   MCV 84.6 78.0 - 100.0 fL   MCH 27.9 26.0 - 34.0 pg   MCHC 32.9 30.0 - 36.0 g/dL   RDW 13.7 11.5 - 15.5 %   Platelets 138 (L) 150 - 400 K/uL  Protime-INR     Status: None   Collection Time: 06/04/16  3:47 AM  Result Value Ref Range   Prothrombin Time 15.0 11.6 - 15.2 seconds   INR 1.16 0.00 - 1.49  APTT     Status: None   Collection Time: 06/04/16  3:47 AM  Result  Value Ref Range   aPTT 33 24 - 37 seconds  I-stat troponin, ED (not at [facility], [facility])     Status: None   Collection Time: 06/04/16  3:53 AM  Result Value Ref Range   Troponin i, poc 0.00 0.00 - 0.08 ng/mL   Comment 3            Comment: Due to the release kinetics of cTnI, a negative result within the first hours of the onset of symptoms does not rule out myocardial infarction with certainty. If myocardial infarction is still suspected, repeat the test at appropriate intervals.   I-Stat Chem 8, ED  (not at [facility], [facility])     Status: Abnormal   Collection Time: 06/04/16  3:54 AM  Result Value Ref Range   Sodium 142 135 - 145 mmol/L   Potassium 3.9 3.5 - 5.1 mmol/L   Chloride 105 101 - 111 mmol/L   BUN 17 6 - 20 mg/dL   Creatinine, Ser 0.70 0.44 - 1.00 mg/dL   Glucose, Bld 88 65 - 99 mg/dL   Calcium, Ion 1.22 1.13 - 1.30 mmol/L   TCO2 28 0 - 100 mmol/L   Hemoglobin 15.3 (H) 12.0 - 15.0 g/dL   HCT 45.0 36.0 - 46.0 %  MRSA PCR Screening     Status: None   Collection Time: 06/04/16  7:19 AM  Result Value Ref Range   MRSA by PCR NEGATIVE NEGATIVE    Comment:        The GeneXpert MRSA Assay (FDA approved for NASAL specimens only), is one component of a comprehensive MRSA colonization surveillance program. It is not intended to diagnose  MRSA infection nor to guide or monitor treatment for MRSA infections.   Lipid panel     Status: Abnormal   Collection Time: 06/04/16  7:25 AM  Result Value Ref Range   Cholesterol 191 0 - 200 mg/dL   Triglycerides 85 <150 mg/dL   HDL 63 >40 mg/dL   Total CHOL/HDL Ratio 3.0 RATIO   VLDL 17 0 - 40 mg/dL   LDL Cholesterol 111 (H) 0 - 99 mg/dL    Comment:        Total Cholesterol/HDL:CHD Risk Coronary Heart Disease Risk Table                     Men   Women  1/2 Average Risk   3.4   3.3  Average Risk       5.0   4.4  2 X Average Risk   9.6   7.1  3 X Average Risk  23.4   11.0        Use the calculated Patient Ratio above and the CHD Risk Table to determine the patient's CHD Risk.        ATP III CLASSIFICATION (LDL):  <100     mg/dL   Optimal  100-129  mg/dL   Near or Above                    Optimal  130-159  mg/dL   Borderline  160-189  mg/dL   High  >190     mg/dL   Very High   Urine rapid drug screen (hosp performed)     Status: None   Collection Time: 06/04/16  9:20 AM  Result Value Ref Range   Opiates NONE DETECTED NONE DETECTED   Cocaine NONE DETECTED NONE DETECTED   Benzodiazepines NONE DETECTED NONE DETECTED  Amphetamines NONE DETECTED NONE DETECTED   Tetrahydrocannabinol NONE DETECTED NONE DETECTED   Barbiturates NONE DETECTED NONE DETECTED    Comment:        DRUG SCREEN FOR MEDICAL PURPOSES ONLY.  IF CONFIRMATION IS NEEDED FOR ANY PURPOSE, NOTIFY LAB WITHIN 5 DAYS.        LOWEST DETECTABLE LIMITS FOR URINE DRUG SCREEN Drug Class       Cutoff (ng/mL) Amphetamine      1000 Barbiturate      200 Benzodiazepine   962 Tricyclics       952 Opiates          300 Cocaine          300 THC              50   Glucose, capillary     Status: None   Collection Time: 06/04/16 12:16 PM  Result Value Ref Range   Glucose-Capillary 86 65 - 99 mg/dL     Lipid Panel     Component Value Date/Time   CHOL 191 06/04/2016 0725   TRIG 85 06/04/2016 0725   HDL 63  06/04/2016 0725   CHOLHDL 3.0 06/04/2016 0725   VLDL 17 06/04/2016 0725   LDLCALC 111* 06/04/2016 0725     No results found for: HGBA1C   Lab Results  Component Value Date   LDLCALC 111* 06/04/2016   CREATININE 0.70 06/04/2016     HPI :  70 y.o. female with medical history significant of hypertension, headache, who presents with left-sided weakness and difficulty speaking.  Patient reports that she had 2 episode of left-sided weakness and difficulty speaking. First episode happened at about 10 AM. She had weakness in left arm, left foot and lower leg. She also had difficulty speaking, feeding like difficulty using her tongue. It lasted for about 30 minutes, then resolved spontaneously. She had had another similar episode at about 6 PM, which lasted for about 15-20 minutes, then resolved spontaneously. She did not have vision change or hearing loss. No tingling sensations in her extremities. Patient did not have headache, chest pain, dizziness. Patient denies nausea, vomiting, abdominal pain, diarrhea or symptoms of a UTI. No fever or chills.  ED Course: pt was found to have WBC 5.6, negative troponin, temperature normal, bradycardia, electrolytes and renal function okay. Negative CT head for acute intracranial abnormalities.  HOSPITAL COURSE:    TIA (transient ischemic attack): pt's symptoms are concerning for TIA. Her risk factors include old age, hypertension, and family history of stroke. Currently patient is asymptomatic.  Telemetry shows sinus rhythm, -Atrial fibrillation: not present  -tPA was not given because symptoms has resolved completely, and also are out of window for tPA - Risk factor modification: HgbA1c, fasting lipid panel - MRI, MRA of the brain without contrast -negative for acute infarct, moderate chronic microvascular ischemic changes, - PT consult, OT consult -pending - Bedside swallowing screen was ordered, within normal limits, no cognitive deficits - 2 d  Echocardiogram  pending - Ekg was done - Carotid dopplers -within normal limits Started patient on aspirin 325 mg and Lipitor 20 mg given LDL of 111 UDS negative   Hypertension: bp is 173/88. -Irbesartan -Hydralazine when necessary   Discharge Exam:    Blood pressure 164/66, pulse 59, temperature 97.6 F (36.4 C), temperature source Oral, resp. rate 18, height 5' (1.524 m), weight 58.605 kg (129 lb 3.2 oz), SpO2 100 %.  Cardiac: S1/S2, RRR, No murmurs, No gallops or rubs. Pulm: No rales,  wheezing, rhonchi or rubs. Abd: Soft, nondistended, nontender, no rebound pain, no organomegaly, BS present. GU: No hematuria Ext: No pitting leg edema bilaterally. 2+DP/PT pulse bilaterally. Musculoskeletal: No joint deformities, No joint redness or warmth, no limitation of ROM in spin. Skin: No rashes.  Neuro: Alert, oriented X3, cranial nerves II-XII grossly intact, moves all extremities normally. Muscle strength 5/5 in all extremities, sensation to light touch intact. Knee reflex 1+ bilaterally. Negative Babinski's sign. Normal finger to nose test. Psych: Patient is not psychotic, no suicidal or hemocidal ideation.    Follow-up Information    Follow up with PCP. Schedule an appointment as soon as possible for a visit in 3 days.      SignedReyne Dumas 06/04/2016, 12:38 PM        Time spent >45 mins

## 2016-06-04 NOTE — Care Management Note (Signed)
Case Management Note  Patient Details  Name: Terri Turner MRN: [id number] Date of Birth: 1946-07-16  Subjective/Objective:     Pt in with TIA. MRI results negative. She is from home with her spouse.               Action/Plan: Plan is to discharge home with self care. No further needs per CM.   Expected Discharge Date:                  Expected Discharge Plan:  Home/Self Care  In-House Referral:     Discharge planning Services     Post Acute Care Choice:    Choice offered to:     DME Arranged:    DME Agency:     HH Arranged:    [place] Agency:     Status of Service:  Completed, signed off  Medicare Important Message Given:    Date Medicare IM Given:    Medicare IM give by:    Date Additional Medicare IM Given:    Additional Medicare Important Message give by:     If discussed at [place] of Stay Meetings, dates discussed:    Additional Comments:  Pollie Friar, RN 06/04/2016, 1:01 PM

## 2016-06-05 LAB — HEMOGLOBIN A1C
HEMOGLOBIN A1C: 5.5 % (ref 4.8–5.6)
MEAN PLASMA GLUCOSE: 111 mg/dL

## 2016-06-11 DIAGNOSIS — G459 Transient cerebral ischemic attack, unspecified: Secondary | ICD-10-CM | POA: Diagnosis not present

## 2016-06-24 DIAGNOSIS — R3121 Asymptomatic microscopic hematuria: Secondary | ICD-10-CM | POA: Diagnosis not present

## 2016-06-24 DIAGNOSIS — R351 Nocturia: Secondary | ICD-10-CM | POA: Diagnosis not present

## 2016-06-29 DIAGNOSIS — N362 Urethral caruncle: Secondary | ICD-10-CM | POA: Diagnosis not present

## 2016-06-29 DIAGNOSIS — N952 Postmenopausal atrophic vaginitis: Secondary | ICD-10-CM | POA: Diagnosis not present

## 2016-07-16 DIAGNOSIS — R3129 Other microscopic hematuria: Secondary | ICD-10-CM | POA: Diagnosis not present

## 2016-07-16 DIAGNOSIS — R3121 Asymptomatic microscopic hematuria: Secondary | ICD-10-CM | POA: Diagnosis not present

## 2016-07-23 DIAGNOSIS — R351 Nocturia: Secondary | ICD-10-CM | POA: Diagnosis not present

## 2016-07-23 DIAGNOSIS — R3121 Asymptomatic microscopic hematuria: Secondary | ICD-10-CM | POA: Diagnosis not present

## 2016-08-24 ENCOUNTER — Other Ambulatory Visit: Payer: Self-pay

## 2016-09-01 DIAGNOSIS — R0982 Postnasal drip: Secondary | ICD-10-CM | POA: Diagnosis not present

## 2016-09-01 DIAGNOSIS — J029 Acute pharyngitis, unspecified: Secondary | ICD-10-CM | POA: Diagnosis not present

## 2016-09-02 DIAGNOSIS — J029 Acute pharyngitis, unspecified: Secondary | ICD-10-CM | POA: Diagnosis not present

## 2016-11-05 ENCOUNTER — Other Ambulatory Visit: Payer: Self-pay | Admitting: Internal Medicine

## 2016-11-05 DIAGNOSIS — Z1231 Encounter for screening mammogram for malignant neoplasm of breast: Secondary | ICD-10-CM

## 2016-11-22 DIAGNOSIS — G43909 Migraine, unspecified, not intractable, without status migrainosus: Secondary | ICD-10-CM | POA: Diagnosis not present

## 2016-11-22 DIAGNOSIS — H9319 Tinnitus, unspecified ear: Secondary | ICD-10-CM | POA: Diagnosis not present

## 2016-11-22 DIAGNOSIS — H9190 Unspecified hearing loss, unspecified ear: Secondary | ICD-10-CM | POA: Diagnosis not present

## 2016-12-01 ENCOUNTER — Ambulatory Visit
Admission: RE | Admit: 2016-12-01 | Discharge: 2016-12-01 | Disposition: A | Discharge status: Home / Self Care | Payer: Medicare Other | Source: Ambulatory Visit | Attending: Internal Medicine | Admitting: Internal Medicine

## 2016-12-01 DIAGNOSIS — Z1231 Encounter for screening mammogram for malignant neoplasm of breast: Secondary | ICD-10-CM | POA: Diagnosis not present

## 2016-12-10 DIAGNOSIS — H9311 Tinnitus, right ear: Secondary | ICD-10-CM | POA: Diagnosis not present

## 2016-12-10 DIAGNOSIS — H903 Sensorineural hearing loss, bilateral: Secondary | ICD-10-CM | POA: Diagnosis not present

## 2016-12-27 ENCOUNTER — Telehealth: Payer: Self-pay | Admitting: Neurology

## 2016-12-27 ENCOUNTER — Encounter: Payer: Self-pay | Admitting: Neurology

## 2016-12-27 ENCOUNTER — Ambulatory Visit (INDEPENDENT_AMBULATORY_CARE_PROVIDER_SITE_OTHER): Payer: Medicare Other | Admitting: Neurology

## 2016-12-27 VITALS — BP 137/79 | HR 71 | Ht 61.0 in | Wt 136.0 lb

## 2016-12-27 DIAGNOSIS — I10 Essential (primary) hypertension: Secondary | ICD-10-CM

## 2016-12-27 DIAGNOSIS — G451 Carotid artery syndrome (hemispheric): Secondary | ICD-10-CM | POA: Diagnosis not present

## 2016-12-27 DIAGNOSIS — G43009 Migraine without aura, not intractable, without status migrainosus: Secondary | ICD-10-CM

## 2016-12-27 DIAGNOSIS — G44209 Tension-type headache, unspecified, not intractable: Secondary | ICD-10-CM | POA: Diagnosis not present

## 2016-12-27 MED ORDER — ATORVASTATIN CALCIUM 10 MG PO TABS: 20.0000 mg | ORAL_TABLET | Freq: Every day | ORAL | 3 refills | Status: DC

## 2016-12-27 MED ORDER — NORTRIPTYLINE HCL 10 MG PO CAPS: 10.0000 mg | ORAL_CAPSULE | Freq: Every day | ORAL | 0 refills | Status: DC

## 2016-12-27 MED ORDER — ATORVASTATIN CALCIUM 10 MG PO TABS: 10.0000 mg | ORAL_TABLET | Freq: Every day | ORAL | 3 refills | Status: DC

## 2016-12-27 MED ORDER — NORTRIPTYLINE HCL 25 MG PO CAPS: 25.0000 mg | ORAL_CAPSULE | Freq: Every day | ORAL | 3 refills | Status: DC

## 2016-12-27 NOTE — Progress Notes (Signed)
NEUROLOGY CLINIC NEW PATIENT NOTE  NAME: Terri Turner DOB: 1946/09/27 REFERRING PHYSICIAN: Thressa Sheller, MD  I saw Terri Turner as a new consult in the neurovascular clinic today.  HPI: Terri Turner is a 72 y.o. female with PMH of HTN, HA, and TIA who presents as a new patient for HA.   Pt stated that she has had migraine for 20 years. Whole head hurts, sharp pain, constant, may last for a week, happens twice a month. She was seen by her neurologist in [place] at that time and was put on fioricet daily dose. It worked for a while and she did feel better. She eventually weaned off fioricet. She occasionally use tylenol also for the HA.   She was admitted in 05/2016 for 2 episodes of left sided weakness and difficulty speaking, lasting 15-58min. MRI and CT negative for stroke. MRA head, CUS negative and 2D EF 60-65%. LDL 111 and A1C 5.5. She is on ASA and lipitor.   However, since the TIA, her HA seems getting worse. Seems more frequent and HA more at the back of eyes, feels pressure around eyes, voice funny, photo and phonophobia. The HA more head band like around the frontal and temporal region, feels tight, associated with neck tightness and neck discomfort, some burning sensation at the top of head. Her tinnitus got worse too. She has tinnitus for 20 years in both ears, was treated before with phenergan. she recently she went to see ENT and was told some hearing loss but no significant abnormalities. For the last 4 weeks, her HA getting worse and worse, almost everyday, sometimes really bad, and she started to take fioricet again one week ago. She has not been giving any HA preventive medication before.   She admitted that she has recent stress at home. She feels she is more easily irritated and more angry than before. She was using estrogen supplement with vaginal ring, which was removed in 02/2016. She has Hx of HTN on BP meds at home. Today BP 137/79.   Past Medical History:    Diagnosis Date  . Headache   . Heart murmur    not treated..  . Hypertension   . Stroke [facility])    Past Surgical History:  Procedure Laterality Date  . CHOLECYSTECTOMY    . HYSTEROSCOPY W/D&C N/A 12/29/2015   Procedure: DILATATION AND CURETTAGE /HYSTEROSCOPY;  Surgeon: Janyth Pupa, DO;  Location: [facility];  Service: Gynecology;  Laterality: N/A;  . URINARY SURGERY     mesh   . WRIST SURGERY     left thumb (pin)   Family History  Problem Relation Age of Onset  . Hypertension Mother   . Stroke Father   . Prostate cancer Brother   . Hypertension Sister   . High blood pressure Sister   . Alzheimer's disease Sister    Current Outpatient Prescriptions  Medication Sig Dispense Refill  . acetaminophen (TYLENOL) 325 MG tablet Take 650 mg by mouth every 6 (six) hours as needed.    Marland Kitchen aspirin 325 MG tablet Take 1 tablet (325 mg total) by mouth daily. 30 tablet 1  . Butalbital-APAP-Caffeine 50-300-40 MG CAPS     . Cholecalciferol (VITAMIN D) 2000 units CAPS Take 2,000 Units by mouth daily.    . Coenzyme Q10 (CO Q 10 PO) Take 1 tablet by mouth daily.    . fluticasone (FLONASE) 50 MCG/ACT nasal spray Place 1 spray into both nostrils daily as needed.  1  .  GINKGO BILOBA PO Take 2 capsules by mouth daily.     . Omega-3 Fatty Acids (FISH OIL PO) Take 1 capsule by mouth daily.    . promethazine (PHENERGAN) 6.25 MG/5ML syrup   0  . telmisartan (MICARDIS) 80 MG tablet Take by mouth.    Marland Kitchen atorvastatin (LIPITOR) 10 MG tablet Take 1 tablet (10 mg total) by mouth daily. 30 tablet 3  . nortriptyline (PAMELOR) 10 MG capsule Take 1 capsule (10 mg total) by mouth at bedtime. 7 capsule 0  . [START ON 01/03/2017] nortriptyline (PAMELOR) 25 MG capsule Take 1 capsule (25 mg total) by mouth at bedtime. Take 25mg  after 7 days of 10mg  dose. 30 capsule 3   No current facility-administered medications for this visit.    No Known Allergies Social History   Social History  . Marital status: Married    Spouse  name: N/A  . Number of children: N/A  . Years of education: N/A   Occupational History  . Not on file.   Social History Main Topics  . Smoking status: Never Smoker  . Smokeless tobacco: Never Used  . Alcohol use No  . Drug use: No  . Sexual activity: Not Currently   Other Topics Concern  . Not on file   Social History Narrative  . No narrative on file    Review of Systems Full 14 system review of systems performed and notable only for those listed, all others are neg:  Constitutional:   Cardiovascular: murmur Ear/Nose/Throat:  Hearing loss, ringing in ears, spinning sensation Skin:  Eyes:  Blurry vision, eye pain Respiratory:  snoring Gastroitestinal:   Genitourinary:  Hematology/Lymphatic:   Endocrine:  Musculoskeletal:   Allergy/Immunology:   Neurological:  HA, weakness, dizziness Psychiatric:  Sleep: not enough sleep, snoring, restless leg at times   Physical Exam  Vitals:   12/27/16 0913  BP: 137/79  Pulse: 71    General - Well nourished, well developed, in no apparent distress.  Ophthalmologic - Sharp disc margins OU.  Cardiovascular - Regular rate and rhythm with no murmur.   Neck - supple, no nuchal rigidity .  Mental Status -  Level of arousal and orientation to time, place, and person were intact. Language including expression, naming, repetition, comprehension, reading, and writing was assessed and found intact. Fund of Knowledge was assessed and was intact.  Cranial Nerves II - XII - II - Visual field intact OU. III, IV, VI - Extraocular movements intact. V - Facial sensation intact bilaterally. VII - Facial movement intact bilaterally. VIII - Hearing & vestibular intact bilaterally. X - Palate elevates symmetrically. XI - Chin turning & shoulder shrug intact bilaterally. XII - Tongue protrusion intact.  Motor Strength - The patient's strength was normal in all extremities and pronator drift was absent.  Bulk was normal and  fasciculations were absent.   Motor Tone - Muscle tone was assessed at the neck and appendages and was normal.  Reflexes - The patient's reflexes were normal in all extremities and she had no pathological reflexes.  Sensory - Light touch, temperature/pinprick, vibration and proprioception, and Romberg testing were assessed and were normal.    Coordination - The patient had normal movements in the hands and feet with no ataxia or dysmetria.  Tremor was absent.  Gait and Station - The patient's transfers, posture, gait, station, and turns were observed as normal.   Imaging  I have personally reviewed the radiological images below and agree with the radiology interpretations.  06/04/16  MRI and MRA head Moderate chronic microvascular ischemic change in the white matter. No acute infarct Negative MRA circle of Willis.  Lab Review Component     Latest Ref Rng & Units 06/04/2016  Cholesterol     0 - 200 mg/dL 191  Triglycerides     <150 mg/dL 85  HDL Cholesterol     >40 mg/dL 63  Total CHOL/HDL Ratio     RATIO 3.0  VLDL     0 - 40 mg/dL 17  LDL (calc)     0 - 99 mg/dL 111 (H)  Hemoglobin A1C     4.8 - 5.6 % 5.5  Mean Plasma Glucose     mg/dL 111     Assessment and Plan:   In summary, Terri Turner is a 71 y.o. female with PMH of HTN, TIA and migraine HA presents as new pt for worsening HA. She has migraine and was treated with fioricet in the past. Had TIA in 05/2016 but since then the HA getting worse. However, the HA by description more consistent with tension HA on top of her migraine. She admitted recent stress. Will prescribe nortriptyline for HA prevention, depression and sleep. Continue ASA for stroke prevention but add lipitor.  - continue ASA for stroke prevention - will start low dose lipitor for cholesterol and for stroke prevention - relaxation and destress still the key - use tylenol and fioricet for acute head treatment.  - initiate nortriptyline for headache  prevention. Take 10mg  nightly for 7 days and then 25mg  at night - Follow up with your primary care physician for stroke risk factor modification. Recommend maintain blood pressure goal <130/80, diabetes with hemoglobin A1c goal below 7.0% and lipids with LDL cholesterol goal below 70 mg/dL.  - check BP at home  - follow up in 2 months with me.    Thank you very much for the opportunity to participate in the care of this patient.  Please do not hesitate to call if any questions or concerns arise.  No orders of the defined types were placed in this encounter.   Meds ordered this encounter  Medications  . Butalbital-APAP-Caffeine 50-300-40 MG CAPS  . DISCONTD: aspirin (GOODSENSE ASPIRIN) 325 MG tablet    Sig: Take by mouth.  . DISCONTD: Cholecalciferol (VITAMIN D3) 2000 units capsule    Sig: Take by mouth.  . DISCONTD: Omega-3 Fatty Acids (FISH OIL PO)    Sig: Take by mouth.  . telmisartan (MICARDIS) 80 MG tablet    Sig: Take by mouth.  . promethazine (PHENERGAN) 6.25 MG/5ML syrup    Refill:  0  . DISCONTD: rizatriptan (MAXALT) 5 MG tablet  . acetaminophen (TYLENOL) 325 MG tablet    Sig: Take 650 mg by mouth every 6 (six) hours as needed.  . nortriptyline (PAMELOR) 10 MG capsule    Sig: Take 1 capsule (10 mg total) by mouth at bedtime.    Dispense:  7 capsule    Refill:  0  . nortriptyline (PAMELOR) 25 MG capsule    Sig: Take 1 capsule (25 mg total) by mouth at bedtime. Take 25mg  after 7 days of 10mg  dose.    Dispense:  30 capsule    Refill:  3  . DISCONTD: atorvastatin (LIPITOR) 10 MG tablet    Sig: Take 2 tablets (20 mg total) by mouth daily.    Dispense:  90 tablet    Refill:  3  . atorvastatin (LIPITOR) 10 MG tablet    Sig:  Take 1 tablet (10 mg total) by mouth daily.    Dispense:  30 tablet    Refill:  3    Patient Instructions  - continue ASA for stroke prevention - will start low dose lipitor for cholesterol and for stroke prevention - relaxation and destress still  the key - use tylenol and fioricet for acute head treatment.  - will start nortriptyline for headache prevention. Take 10mg  nightly for 7 days and then 25mg  at night - Follow up with your primary care physician for stroke risk factor modification. Recommend maintain blood pressure goal <130/80, diabetes with hemoglobin A1c goal below 7.0% and lipids with LDL cholesterol goal below 70 mg/dL.  - check BP at home  - follow up in 2 months with me.    Rosalin Hawking, MD PhD [facility] [address] [place], [place] [address] [phone]

## 2016-12-27 NOTE — Telephone Encounter (Signed)
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 Rn stated the 20mg  of lipitor should be cancel. Rn stated the new rx is lipitor is 10mg  daily. The pharmacy will make that change.

## 2016-12-27 NOTE — Patient Instructions (Addendum)
-   continue ASA for stroke prevention - will start low dose lipitor for cholesterol and for stroke prevention - relaxation and destress still the key - use tylenol and fioricet for acute head treatment.  - will start nortriptyline for headache prevention. Take 10mg  nightly for 7 days and then 25mg  at night - Follow up with your primary care physician for stroke risk factor modification. Recommend maintain blood pressure goal <130/80, diabetes with hemoglobin A1c goal below 7.0% and lipids with LDL cholesterol goal below 70 mg/dL.  - check BP at home  - follow up in 2 months with me.

## 2016-12-27 NOTE — Telephone Encounter (Signed)
My mistake. I have corrected to 10mg  lipitor daily. Thanks.   Rosalin Hawking, MD PhD Stroke Neurology 12/27/2016 12:13 PM

## 2016-12-27 NOTE — Telephone Encounter (Signed)
[facility] pharmacy called needing clarification on patients atorvastatin (LIPITOR) 10 MG tablet. Please call

## 2016-12-28 DIAGNOSIS — G43709 Chronic migraine without aura, not intractable, without status migrainosus: Secondary | ICD-10-CM | POA: Insufficient documentation

## 2017-01-19 DIAGNOSIS — H903 Sensorineural hearing loss, bilateral: Secondary | ICD-10-CM | POA: Diagnosis not present

## 2017-01-19 DIAGNOSIS — H9311 Tinnitus, right ear: Secondary | ICD-10-CM | POA: Diagnosis not present

## 2017-04-06 ENCOUNTER — Encounter: Payer: Self-pay | Admitting: Neurology

## 2017-04-06 ENCOUNTER — Ambulatory Visit (INDEPENDENT_AMBULATORY_CARE_PROVIDER_SITE_OTHER): Payer: Medicare Other | Admitting: Neurology

## 2017-04-06 ENCOUNTER — Encounter (INDEPENDENT_AMBULATORY_CARE_PROVIDER_SITE_OTHER): Payer: Self-pay

## 2017-04-06 VITALS — BP 115/77 | HR 93 | Wt 130.0 lb

## 2017-04-06 DIAGNOSIS — I10 Essential (primary) hypertension: Secondary | ICD-10-CM | POA: Diagnosis not present

## 2017-04-06 DIAGNOSIS — G44209 Tension-type headache, unspecified, not intractable: Secondary | ICD-10-CM | POA: Diagnosis not present

## 2017-04-06 DIAGNOSIS — G43009 Migraine without aura, not intractable, without status migrainosus: Secondary | ICD-10-CM

## 2017-04-06 DIAGNOSIS — G451 Carotid artery syndrome (hemispheric): Secondary | ICD-10-CM

## 2017-04-06 NOTE — Patient Instructions (Signed)
-   continue ASA and lipitor for stroke prevention - relaxation and de-stress are the key - use tylenol for tension headache treatment. Use fioricet as needed.  - continue nortriptyline 25mg  at night but add melatonin to help sleep. Use promethazine as needed.  - Follow up with your primary care physician for stroke risk factor modification. Recommend maintain blood pressure goal <130/80, diabetes with hemoglobin A1c goal below 7.0% and lipids with LDL cholesterol goal below 70 mg/dL.  - check BP at home  - follow up in 6 months.

## 2017-04-06 NOTE — Progress Notes (Signed)
NEUROLOGY CLINIC NEW PATIENT NOTE  NAME: Terri Turner DOB: 08/04/1946 REFERRING PHYSICIAN: Thressa Sheller, MD  I saw Terri Turner as a new consult in the neurovascular clinic today.  HPI: Terri Turner is a 71 y.o. female with PMH of HTN, HA, and TIA who presents as a new patient for HA.   Pt stated that she has had migraine for 20 years. Whole head hurts, sharp pain, constant, may last for a week, happens twice a month. She was seen by her neurologist in [place] at that time and was put on fioricet daily dose. It worked for a while and she did feel better. She eventually weaned off fioricet. She occasionally use tylenol also for the HA.   She was admitted in 05/2016 for 2 episodes of left sided weakness and difficulty speaking, lasting 15-13min. MRI and CT negative for stroke. MRA head, CUS negative and 2D EF 60-65%. LDL 111 and A1C 5.5. She is on ASA and lipitor.   However, since the TIA, her HA seems getting worse. Seems more frequent and HA more at the back of eyes, feels pressure around eyes, voice funny, photo and phonophobia. The HA more head band like around the frontal and temporal region, feels tight, associated with neck tightness and neck discomfort, some burning sensation at the top of head. Her tinnitus got worse too. She has tinnitus for 20 years in both ears, was treated before with phenergan. she recently she went to see ENT and was told some hearing loss but no significant abnormalities. For the last 4 weeks, her HA getting worse and worse, almost everyday, sometimes really bad, and she started to take fioricet again one week ago. She has not been giving any HA preventive medication before.   She admitted that she has recent stress at home. She feels she is more easily irritated and more angry than before. She was using estrogen supplement with vaginal ring, which was removed in 02/2016. She has Hx of HTN on BP meds at home. Today BP 137/79.   Interval history During  the interval time, pt has been doing well. Stated that her HA much improved after nortriptyline. But sometimes still has heaviness in the afternoon, likely due to stress and anxiety. Has some good days and bad days. However, she feels nortriptyline makes her wake up more frequently at night and she can not get sound sleep, but she does not want to d/c nortriptyline as she is afraid HA will come back. She use phenergan PRN and it helps her sleep. She use tylenol and fioricet for acute HA and effective. On ASA and lipitor without side effects. Admitted that she still has a lot of stress and anxious about a lot of things. BP today 115/77.  Past Medical History:  Diagnosis Date  . Headache   . Heart murmur    not treated..  . Hypertension   . Stroke [facility])    Past Surgical History:  Procedure Laterality Date  . CHOLECYSTECTOMY    . HYSTEROSCOPY W/D&C N/A 12/29/2015   Procedure: DILATATION AND CURETTAGE /HYSTEROSCOPY;  Surgeon: Janyth Pupa, DO;  Location: [facility];  Service: Gynecology;  Laterality: N/A;  . URINARY SURGERY     mesh   . WRIST SURGERY     left thumb (pin)   Family History  Problem Relation Age of Onset  . Hypertension Mother   . Stroke Father   . Prostate cancer Brother   . Hypertension Sister   . High  blood pressure Sister   . Alzheimer's disease Sister    Current Outpatient Prescriptions  Medication Sig Dispense Refill  . acetaminophen (TYLENOL) 325 MG tablet Take 650 mg by mouth every 6 (six) hours as needed.    Marland Kitchen aspirin 325 MG tablet Take 1 tablet (325 mg total) by mouth daily. 30 tablet 1  . atorvastatin (LIPITOR) 10 MG tablet Take 1 tablet (10 mg total) by mouth daily. 30 tablet 3  . Butalbital-APAP-Caffeine 50-300-40 MG CAPS     . Cholecalciferol (VITAMIN D) 2000 units CAPS Take 2,000 Units by mouth daily.    . Coenzyme Q10 (CO Q 10 PO) Take 1 tablet by mouth daily.    . fluticasone (FLONASE) 50 MCG/ACT nasal spray Place 1 spray into both nostrils daily as needed.   1  . GINKGO BILOBA PO Take 2 capsules by mouth daily.     . nortriptyline (PAMELOR) 25 MG capsule Take 1 capsule (25 mg total) by mouth at bedtime. Take 25mg  after 7 days of 10mg  dose. 30 capsule 3  . Omega-3 Fatty Acids (FISH OIL PO) Take 1 capsule by mouth daily.    . promethazine (PHENERGAN) 6.25 MG/5ML syrup   0  . telmisartan (MICARDIS) 80 MG tablet Take by mouth.     No current facility-administered medications for this visit.    No Known Allergies Social History   Social History  . Marital status: Married    Spouse name: N/A  . Number of children: N/A  . Years of education: N/A   Occupational History  . Not on file.   Social History Main Topics  . Smoking status: Never Smoker  . Smokeless tobacco: Never Used  . Alcohol use No  . Drug use: No  . Sexual activity: Not Currently   Other Topics Concern  . Not on file   Social History Narrative  . No narrative on file    Review of Systems Full 14 system review of systems performed and notable only for those listed, all others are neg:  Constitutional:   Cardiovascular:  Ear/Nose/Throat:  Hearing loss, ringing in ears Skin:  Eyes:   Respiratory:   Gastroitestinal:   Genitourinary:  Hematology/Lymphatic:   Endocrine:  Musculoskeletal:   Allergy/Immunology:   Neurological:  HA, dizziness Psychiatric:  Sleep: frequent waking, daytime sleepiness   Physical Exam  Vitals:   04/06/17 1504  BP: 115/77  Pulse: 93    General - Well nourished, well developed, in no apparent distress.  Ophthalmologic - Sharp disc margins OU.  Cardiovascular - Regular rate and rhythm with no murmur.   Neck - supple, no nuchal rigidity .  Mental Status -  Level of arousal and orientation to time, place, and person were intact. Language including expression, naming, repetition, comprehension, reading, and writing was assessed and found intact. Fund of Knowledge was assessed and was intact.  Cranial Nerves II - XII - II -  Visual field intact OU. III, IV, VI - Extraocular movements intact. V - Facial sensation intact bilaterally. VII - Facial movement intact bilaterally. VIII - Hearing & vestibular intact bilaterally. X - Palate elevates symmetrically. XI - Chin turning & shoulder shrug intact bilaterally. XII - Tongue protrusion intact.  Motor Strength - The patient's strength was normal in all extremities and pronator drift was absent.  Bulk was normal and fasciculations were absent.   Motor Tone - Muscle tone was assessed at the neck and appendages and was normal.  Reflexes - The patient's reflexes were normal  in all extremities and she had no pathological reflexes.  Sensory - Light touch, temperature/pinprick, vibration and proprioception, and Romberg testing were assessed and were normal.    Coordination - The patient had normal movements in the hands and feet with no ataxia or dysmetria.  Tremor was absent.  Gait and Station - The patient's transfers, posture, gait, station, and turns were observed as normal.   Imaging  I have personally reviewed the radiological images below and agree with the radiology interpretations.  06/04/16 MRI and MRA head Moderate chronic microvascular ischemic change in the white matter. No acute infarct Negative MRA circle of Willis.  Lab Review Component     Latest Ref Rng & Units 06/04/2016  Cholesterol     0 - 200 mg/dL 191  Triglycerides     <150 mg/dL 85  HDL Cholesterol     >40 mg/dL 63  Total CHOL/HDL Ratio     RATIO 3.0  VLDL     0 - 40 mg/dL 17  LDL (calc)     0 - 99 mg/dL 111 (H)  Hemoglobin A1C     4.8 - 5.6 % 5.5  Mean Plasma Glucose     mg/dL 111     Assessment:   In summary, Terri Turner is a 71 y.o. female with PMH of HTN, TIA and migraine HA presents as new pt for worsening HA. She has migraine and was treated with fioricet in the past. Had TIA in 05/2016 but since then the HA getting worse. However, the HA by description more  consistent with tension HA on top of her migraine. She admitted recent stress and anxious all the time. Prescribe nortriptyline for HA prevention and felt better but complains of frequent waking up at night. Uses phenergan PRN for sleep. Continue ASA and lipitor for stroke prevention.  Plan: - continue ASA and lipitor for stroke prevention - relaxation and de-stress are the key - use tylenol for tension headache treatment. Use fioricet as needed.  - continue nortriptyline 25mg  at night but add melatonin to help sleep. Use promethazine as needed.  - Follow up with your primary care physician for stroke risk factor modification. Recommend maintain blood pressure goal <130/80, diabetes with hemoglobin A1c goal below 7.0% and lipids with LDL cholesterol goal below 70 mg/dL.  - check BP at home  - follow up in 6 months.   I spent more than 25 minutes of face to face time with the patient. Greater than 50% of time was spent in counseling and coordination of care. We discussed HA treatment, insomnia treatment and de-stress strategy.    No orders of the defined types were placed in this encounter.   No orders of the defined types were placed in this encounter.   Patient Instructions  - continue ASA and lipitor for stroke prevention - relaxation and de-stress are the key - use tylenol for tension headache treatment. Use fioricet as needed.  - continue nortriptyline 25mg  at night but add melatonin to help sleep. Use promethazine as needed.  - Follow up with your primary care physician for stroke risk factor modification. Recommend maintain blood pressure goal <130/80, diabetes with hemoglobin A1c goal below 7.0% and lipids with LDL cholesterol goal below 70 mg/dL.  - check BP at home  - follow up in 6 months.    Rosalin Hawking, MD PhD [facility] [address], [place], [place] [address] [phone]

## 2017-05-19 ENCOUNTER — Telehealth: Payer: Self-pay | Admitting: Neurology

## 2017-05-19 ENCOUNTER — Other Ambulatory Visit: Payer: Self-pay

## 2017-05-19 DIAGNOSIS — G43009 Migraine without aura, not intractable, without status migrainosus: Secondary | ICD-10-CM

## 2017-05-19 DIAGNOSIS — G44209 Tension-type headache, unspecified, not intractable: Secondary | ICD-10-CM

## 2017-05-19 MED ORDER — NORTRIPTYLINE HCL 25 MG PO CAPS: 25.0000 mg | ORAL_CAPSULE | Freq: Every day | ORAL | 2 refills | Status: DC

## 2017-05-19 NOTE — Telephone Encounter (Signed)
Refill for pamelor done for 90 days for 2 refills.

## 2017-05-19 NOTE — Telephone Encounter (Signed)
Pt request refill for nortriptyline (PAMELOR) 25 MG capsule sent [facility]. Pt is out of medication as of this past Tuesday

## 2017-05-23 ENCOUNTER — Telehealth: Payer: Self-pay | Admitting: Neurology

## 2017-05-23 ENCOUNTER — Other Ambulatory Visit: Payer: Self-pay

## 2017-05-23 MED ORDER — ATORVASTATIN CALCIUM 10 MG PO TABS: 10.0000 mg | ORAL_TABLET | Freq: Every day | ORAL | 1 refills | Status: DC

## 2017-05-23 NOTE — Telephone Encounter (Signed)
Refill done for patient until next appt. Sent to pharmacy this am via computer.

## 2017-05-23 NOTE — Telephone Encounter (Signed)
Patient called office requesting refill for atorvastatin (LIPITOR) 10 MG tablet.

## 2017-08-01 DIAGNOSIS — J209 Acute bronchitis, unspecified: Secondary | ICD-10-CM | POA: Diagnosis not present

## 2017-09-13 ENCOUNTER — Telehealth: Payer: Self-pay | Admitting: Neurology

## 2017-09-13 NOTE — Telephone Encounter (Signed)
Pt called in she is experiencing numbness in the rt little finger and some HA's over the past 4 days. Said these symptoms are similar to the TIA symptoms except it was the left hand numbness. Pt was wanting to be seen by Dr Erlinda Hong, I explained he is not in the office this week, the clinic does not triage and she should seek ED or Urgent Care facility if she feels this is an emergency. I advised to contact PCP if she did not want to go to ED, pt said PA could not see her for a few months. Pt said she would try an Urgent Care.  FYI

## 2017-09-13 NOTE — Telephone Encounter (Signed)
This is FYI DR. Xu.

## 2017-09-14 NOTE — Telephone Encounter (Signed)
Discussed with patient over the phone. She stated that she is recently under stress, his best friend had lymphoma and he had to taking care of him in the hospital. She believes that her recent headache is due to stress and tension headache, which I agree. He also mentioned that he had right hand middle finger stiffness, denies significant pain. She said that when she moves the middle finger and feels strange feeling and mild pain, but still able to make a fist. She denies any numbness, weakness, or left-hand symptoms.  I told her that this is not TIA or stroke, but most likely due to arthritis or joint effusion. Patient has her own hand surgeon and PCP, I recommend her to call PCP and hand surgeon for further evaluation. She has appointment with me on 10/17/17. And I will see her at that time. She expressed an standing and appreciation.  Rosalin Hawking, MD PhD Stroke Neurology 09/14/2017 1:06 PM

## 2017-09-19 DIAGNOSIS — Z23 Encounter for immunization: Secondary | ICD-10-CM | POA: Diagnosis not present

## 2017-09-19 DIAGNOSIS — Z0000 Encounter for general adult medical examination without abnormal findings: Secondary | ICD-10-CM | POA: Diagnosis not present

## 2017-09-19 DIAGNOSIS — E7800 Pure hypercholesterolemia, unspecified: Secondary | ICD-10-CM | POA: Diagnosis not present

## 2017-09-19 DIAGNOSIS — E559 Vitamin D deficiency, unspecified: Secondary | ICD-10-CM | POA: Diagnosis not present

## 2017-09-19 DIAGNOSIS — I10 Essential (primary) hypertension: Secondary | ICD-10-CM | POA: Diagnosis not present

## 2017-09-19 DIAGNOSIS — R748 Abnormal levels of other serum enzymes: Secondary | ICD-10-CM | POA: Diagnosis not present

## 2017-10-17 ENCOUNTER — Encounter: Payer: Self-pay | Admitting: Neurology

## 2017-10-17 ENCOUNTER — Ambulatory Visit (INDEPENDENT_AMBULATORY_CARE_PROVIDER_SITE_OTHER): Payer: Medicare Other | Admitting: Neurology

## 2017-10-17 VITALS — BP 116/72 | HR 86 | Ht 61.0 in | Wt 132.8 lb

## 2017-10-17 DIAGNOSIS — G44209 Tension-type headache, unspecified, not intractable: Secondary | ICD-10-CM

## 2017-10-17 DIAGNOSIS — I10 Essential (primary) hypertension: Secondary | ICD-10-CM

## 2017-10-17 DIAGNOSIS — G43009 Migraine without aura, not intractable, without status migrainosus: Secondary | ICD-10-CM

## 2017-10-17 DIAGNOSIS — G459 Transient cerebral ischemic attack, unspecified: Secondary | ICD-10-CM

## 2017-10-17 NOTE — Progress Notes (Signed)
NEUROLOGY CLINIC NEW PATIENT NOTE  NAME: Terri Turner DOB: [birth year] REFERRING PHYSICIAN: Thressa Sheller, MD  I saw Terri Turner as a new consult in the neurovascular clinic today.  HPI: Terri Turner is a 71 y.o. female with PMH of HTN, HA, and TIA who presents as a new patient for HA.   Pt stated that she has had migraine for 20 years. Whole head hurts, sharp pain, constant, may last for a week, happens twice a month. She was seen by her neurologist in [place] at that time and was put on fioricet daily dose. It worked for a while and she did feel better. She eventually weaned off fioricet. She occasionally use tylenol also for the HA.   She was admitted in 05/2016 for 2 episodes of left sided weakness and difficulty speaking, lasting 15-85min. MRI and CT negative for stroke. MRA head, CUS negative and 2D EF 60-65%. LDL 111 and A1C 5.5. She is on ASA and lipitor.   However, since the TIA, her HA seems getting worse. Seems more frequent and HA more at the back of eyes, feels pressure around eyes, voice funny, photo and phonophobia. The HA more head band like around the frontal and temporal region, feels tight, associated with neck tightness and neck discomfort, some burning sensation at the top of head. Her tinnitus got worse too. She has tinnitus for 20 years in both ears, was treated before with phenergan. she recently she went to see ENT and was told some hearing loss but no significant abnormalities. For the last 4 weeks, her HA getting worse and worse, almost everyday, sometimes really bad, and she started to take fioricet again one week ago. She has not been giving any HA preventive medication before.   She admitted that she has recent stress at home. She feels she is more easily irritated and more angry than before. She was using estrogen supplement with vaginal ring, which was removed in 02/2016. She has Hx of HTN on BP meds at home. Today BP 137/79.   04/06/17 follow up - pt has  been doing well. Stated that her HA much improved after nortriptyline. But sometimes still has heaviness in the afternoon, likely due to stress and anxiety. Has some good days and bad days. However, she feels nortriptyline makes her wake up more frequently at night and she can not get sound sleep, but she does not want to d/c nortriptyline as she is afraid HA will come back. She use phenergan PRN and it helps her sleep. She use tylenol and fioricet for acute HA and effective. On ASA and lipitor without side effects. Admitted that she still has a lot of stress and anxious about a lot of things. BP today 115/77.  Interval history During the interval time, pt has been doing well from stroke standpoint. Still has intermittent tension HA as her husband has been diagnosed with lymphoma and she has to take care of him. She also has right middle finger pain on flexion, needs to follow up with hand surgeon. On nortriptyline, helped for HA and sleep. Melatonin has been discontinued by herself. BP today 116/72 in clinic.   Past Medical History:  Diagnosis Date  . Headache   . Heart murmur    not treated..  . Hypertension   . Stroke [facility])    Past Surgical History:  Procedure Laterality Date  . CHOLECYSTECTOMY    . HYSTEROSCOPY W/D&C N/A 12/29/2015   Procedure: DILATATION AND CURETTAGE /HYSTEROSCOPY;  Surgeon: Janyth Pupa, DO;  Location: [facility];  Service: Gynecology;  Laterality: N/A;  . URINARY SURGERY     mesh   . WRIST SURGERY     left thumb (pin)   Family History  Problem Relation Age of Onset  . Hypertension Mother   . Stroke Father   . Prostate cancer Brother   . Hypertension Sister   . High blood pressure Sister   . Alzheimer's disease Sister    Current Outpatient Prescriptions  Medication Sig Dispense Refill  . acetaminophen (TYLENOL) 325 MG tablet Take 650 mg by mouth every 6 (six) hours as needed.    Marland Kitchen aspirin 325 MG tablet Take 1 tablet (325 mg total) by mouth daily. 30 tablet 1  .  atorvastatin (LIPITOR) 10 MG tablet Take 1 tablet (10 mg total) by mouth daily. 90 tablet 1  . Butalbital-APAP-Caffeine 50-300-40 MG CAPS     . Cholecalciferol (VITAMIN D) 2000 units CAPS Take 2,000 Units by mouth daily.    . Coenzyme Q10 (CO Q 10 PO) Take 1 tablet by mouth daily.    Marland Kitchen GINKGO BILOBA PO Take 2 capsules by mouth daily.     . nortriptyline (PAMELOR) 25 MG capsule Take 1 capsule (25 mg total) by mouth at bedtime. Take 25mg  after 7 days of 10mg  dose. 90 capsule 2  . Omega-3 Fatty Acids (FISH OIL PO) Take 1 capsule by mouth daily.    Marland Kitchen telmisartan (MICARDIS) 80 MG tablet Take by mouth.     No current facility-administered medications for this visit.    No Known Allergies Social History   Social History  . Marital status: Married    Spouse name: N/A  . Number of children: N/A  . Years of education: N/A   Occupational History  . Not on file.   Social History Main Topics  . Smoking status: Never Smoker  . Smokeless tobacco: Never Used  . Alcohol use No  . Drug use: No  . Sexual activity: Not Currently   Other Topics Concern  . Not on file   Social History Narrative  . No narrative on file    Review of Systems Full 14 system review of systems performed and notable only for those listed, all others are neg:  Constitutional:   Cardiovascular:  Ear/Nose/Throat:  Hearing loss, ringing in ears Skin:  Eyes:   Respiratory:   Gastroitestinal:   Genitourinary:  Hematology/Lymphatic:   Endocrine:  Musculoskeletal:   Allergy/Immunology:   Neurological:  HA, dizziness Psychiatric:  Sleep: frequent waking, daytime sleepiness   Physical Exam  Vitals:   10/17/17 1104  BP: 116/72  Pulse: 86    General - Well nourished, well developed, in no apparent distress.  Ophthalmologic - Sharp disc margins OU.  Cardiovascular - Regular rate and rhythm with no murmur.   Neck - supple, no nuchal rigidity .  Mental Status -  Level of arousal and orientation to time,  place, and person were intact. Language including expression, naming, repetition, comprehension, reading, and writing was assessed and found intact. Fund of Knowledge was assessed and was intact.  Cranial Nerves II - XII - II - Visual field intact OU. III, IV, VI - Extraocular movements intact. V - Facial sensation intact bilaterally. VII - Facial movement intact bilaterally. VIII - Hearing & vestibular intact bilaterally. X - Palate elevates symmetrically. XI - Chin turning & shoulder shrug intact bilaterally. XII - Tongue protrusion intact.  Motor Strength - The patient's strength was normal in  all extremities and pronator drift was absent.  Bulk was normal and fasciculations were absent.   Motor Tone - Muscle tone was assessed at the neck and appendages and was normal.  Reflexes - The patient's reflexes were normal in all extremities and she had no pathological reflexes.  Sensory - Light touch, temperature/pinprick, vibration and proprioception, and Romberg testing were assessed and were normal.    Coordination - The patient had normal movements in the hands and feet with no ataxia or dysmetria.  Tremor was absent.  Gait and Station - The patient's transfers, posture, gait, station, and turns were observed as normal.   Imaging  I have personally reviewed the radiological images below and agree with the radiology interpretations.  06/04/16 MRI and MRA head Moderate chronic microvascular ischemic change in the white matter. No acute infarct Negative MRA circle of Willis.  Lab Review Component     Latest Ref Rng & Units 06/04/2016  Cholesterol     0 - 200 mg/dL 191  Triglycerides     <150 mg/dL 85  HDL Cholesterol     >40 mg/dL 63  Total CHOL/HDL Ratio     RATIO 3.0  VLDL     0 - 40 mg/dL 17  LDL (calc)     0 - 99 mg/dL 111 (H)  Hemoglobin A1C     4.8 - 5.6 % 5.5  Mean Plasma Glucose     mg/dL 111     Assessment:   In summary, Terri Turner is a 71 y.o.  female with PMH of HTN, TIA and migraine HA presents for worsening HA. She has migraine and was treated with fioricet in the past. Had TIA in 05/2016 but since then the HA getting worse. However, the HA by description more consistent with tension HA on top of her migraine. She admitted recent stress and anxious all the time. Prescribe nortriptyline for HA prevention and felt better. Continue ASA and lipitor for stroke prevention. Right middle finger pain on flexion, pending hand surgeon appointment.   Plan: - continue ASA and lipitor for stroke prevention - relaxation and de-stress and avoid unpleasant sound and light.  - use tylenol for tension headache treatment. Use fioricet as needed.  - continue nortriptyline 25mg  at night to help sleep. Use promethazine as needed.  - Follow up with your primary care physician for stroke risk factor modification. Recommend maintain blood pressure goal <130/80, diabetes with hemoglobin A1c goal below 7.0% and lipids with LDL cholesterol goal below 70 mg/dL.  - check BP at home.  - recommend follow up with hand surgery regarding right hand arthritis.  - follow up in 6 months.   I spent more than 25 minutes of face to face time with the patient. Greater than 50% of time was spent in counseling and coordination of care. We discussed HA treatment, insomnia treatment and follow up with hand surgery.    No orders of the defined types were placed in this encounter.   No orders of the defined types were placed in this encounter.   Patient Instructions  - continue ASA and lipitor for stroke prevention - relaxation and de-stress and avoid unpleasant sound and light.  - use tylenol for tension headache treatment. Use fioricet as needed.  - continue nortriptyline 25mg  at night to help sleep. Use promethazine as needed.  - Follow up with your primary care physician for stroke risk factor modification. Recommend maintain blood pressure goal <130/80, diabetes with  hemoglobin A1c goal  below 7.0% and lipids with LDL cholesterol goal below 70 mg/dL.  - check BP at home.  - recommend follow up with hand surgery regarding right hand arthritis.  - follow up in 6 months.    Rosalin Hawking, MD PhD [facility] [address] [place], [place] [address] [phone]

## 2017-10-17 NOTE — Patient Instructions (Addendum)
-   continue ASA and lipitor for stroke prevention - relaxation and de-stress and avoid unpleasant sound and light.  - use tylenol for tension headache treatment. Use fioricet as needed.  - continue nortriptyline 25mg  at night to help sleep. Use promethazine as needed.  - Follow up with your primary care physician for stroke risk factor modification. Recommend maintain blood pressure goal <130/80, diabetes with hemoglobin A1c goal below 7.0% and lipids with LDL cholesterol goal below 70 mg/dL.  - check BP at home.  - recommend follow up with hand surgery regarding right hand arthritis.  - follow up in 6 months.

## 2017-10-18 DIAGNOSIS — R748 Abnormal levels of other serum enzymes: Secondary | ICD-10-CM | POA: Diagnosis not present

## 2017-10-18 DIAGNOSIS — R740 Nonspecific elevation of levels of transaminase and lactic acid dehydrogenase [LDH]: Secondary | ICD-10-CM | POA: Diagnosis not present

## 2017-10-20 DIAGNOSIS — K838 Other specified diseases of biliary tract: Secondary | ICD-10-CM | POA: Diagnosis not present

## 2017-10-20 DIAGNOSIS — K7460 Unspecified cirrhosis of liver: Secondary | ICD-10-CM | POA: Diagnosis not present

## 2017-10-20 DIAGNOSIS — R748 Abnormal levels of other serum enzymes: Secondary | ICD-10-CM | POA: Diagnosis not present

## 2017-10-31 ENCOUNTER — Telehealth: Payer: Self-pay | Admitting: Neurology

## 2017-10-31 ENCOUNTER — Other Ambulatory Visit: Payer: Self-pay | Admitting: Internal Medicine

## 2017-10-31 DIAGNOSIS — J309 Allergic rhinitis, unspecified: Secondary | ICD-10-CM | POA: Diagnosis not present

## 2017-10-31 DIAGNOSIS — K7460 Unspecified cirrhosis of liver: Secondary | ICD-10-CM | POA: Diagnosis not present

## 2017-10-31 DIAGNOSIS — M169 Osteoarthritis of hip, unspecified: Secondary | ICD-10-CM | POA: Diagnosis not present

## 2017-10-31 DIAGNOSIS — Z8679 Personal history of other diseases of the circulatory system: Secondary | ICD-10-CM | POA: Diagnosis not present

## 2017-10-31 DIAGNOSIS — I129 Hypertensive chronic kidney disease with stage 1 through stage 4 chronic kidney disease, or unspecified chronic kidney disease: Secondary | ICD-10-CM | POA: Diagnosis not present

## 2017-10-31 DIAGNOSIS — R748 Abnormal levels of other serum enzymes: Secondary | ICD-10-CM | POA: Diagnosis not present

## 2017-10-31 DIAGNOSIS — M8580 Other specified disorders of bone density and structure, unspecified site: Secondary | ICD-10-CM | POA: Diagnosis not present

## 2017-10-31 DIAGNOSIS — H9193 Unspecified hearing loss, bilateral: Secondary | ICD-10-CM | POA: Diagnosis not present

## 2017-10-31 DIAGNOSIS — E039 Hypothyroidism, unspecified: Secondary | ICD-10-CM | POA: Diagnosis not present

## 2017-10-31 DIAGNOSIS — H9313 Tinnitus, bilateral: Secondary | ICD-10-CM | POA: Diagnosis not present

## 2017-10-31 DIAGNOSIS — Z1231 Encounter for screening mammogram for malignant neoplasm of breast: Secondary | ICD-10-CM

## 2017-10-31 DIAGNOSIS — G44219 Episodic tension-type headache, not intractable: Secondary | ICD-10-CM | POA: Diagnosis not present

## 2017-10-31 DIAGNOSIS — G43009 Migraine without aura, not intractable, without status migrainosus: Secondary | ICD-10-CM | POA: Diagnosis not present

## 2017-10-31 NOTE — Telephone Encounter (Signed)
RN call patient back about her PCp wanting her to stop lipitor. PT stated she was diagnoses with cirrhosis  of the liver. Pt stated her PCP, Dr Shelia Media wants her to stop taking the medication  Pt wanted to make Dr. Erlinda Hong knew about it. Rn stated per Dr.Xu he is fine with her stopping the lipitor until her complete work up is done for GI. Pt verbalized understanding, and will call back based on Dr. Shelia Media recommendations.

## 2017-10-31 NOTE — Telephone Encounter (Signed)
Please let the pt know that I am fine with stop lipitor to complete the work up. We will follow up with the GI work up and then provide further recommendation on lipitor in the future. Thanks.   Rosalin Hawking, MD PhD Stroke Neurology 10/31/2017 12:39 PM

## 2017-10-31 NOTE — Telephone Encounter (Signed)
Patient called in stating that she say her new PCP Dr. Shelia Media today. He diagnostic her with sirous and informed her to stop taking the Lipitor until after they have the GI work up. She wanted Dr. Erlinda Hong to know this and wanted his opinion on it since he is the one that prescripts it.

## 2017-11-15 ENCOUNTER — Other Ambulatory Visit: Payer: Self-pay | Admitting: Gastroenterology

## 2017-11-15 DIAGNOSIS — K760 Fatty (change of) liver, not elsewhere classified: Secondary | ICD-10-CM | POA: Diagnosis not present

## 2017-11-15 DIAGNOSIS — R7989 Other specified abnormal findings of blood chemistry: Secondary | ICD-10-CM

## 2017-11-15 DIAGNOSIS — R740 Nonspecific elevation of levels of transaminase and lactic acid dehydrogenase [LDH]: Secondary | ICD-10-CM | POA: Diagnosis not present

## 2017-11-15 DIAGNOSIS — R945 Abnormal results of liver function studies: Principal | ICD-10-CM

## 2017-11-15 DIAGNOSIS — R933 Abnormal findings on diagnostic imaging of other parts of digestive tract: Secondary | ICD-10-CM | POA: Diagnosis not present

## 2017-11-18 ENCOUNTER — Ambulatory Visit
Admission: RE | Admit: 2017-11-18 | Discharge: 2017-11-18 | Disposition: A | Discharge status: Home / Self Care | Payer: Medicare Other | Source: Ambulatory Visit | Attending: Gastroenterology | Admitting: Gastroenterology

## 2017-11-18 ENCOUNTER — Other Ambulatory Visit: Payer: Self-pay | Admitting: Gastroenterology

## 2017-11-18 DIAGNOSIS — R945 Abnormal results of liver function studies: Principal | ICD-10-CM

## 2017-11-18 DIAGNOSIS — R7989 Other specified abnormal findings of blood chemistry: Secondary | ICD-10-CM

## 2017-11-18 DIAGNOSIS — R935 Abnormal findings on diagnostic imaging of other abdominal regions, including retroperitoneum: Secondary | ICD-10-CM

## 2017-11-25 ENCOUNTER — Ambulatory Visit
Admission: RE | Admit: 2017-11-25 | Discharge: 2017-11-25 | Disposition: A | Discharge status: Home / Self Care | Payer: Medicare Other | Source: Ambulatory Visit | Attending: Gastroenterology | Admitting: Gastroenterology

## 2017-11-25 DIAGNOSIS — R935 Abnormal findings on diagnostic imaging of other abdominal regions, including retroperitoneum: Secondary | ICD-10-CM

## 2017-11-25 DIAGNOSIS — K7460 Unspecified cirrhosis of liver: Secondary | ICD-10-CM | POA: Diagnosis not present

## 2017-11-25 MED ORDER — IOPAMIDOL (ISOVUE-300) INJECTION 61%
100.0000 mL | Freq: Once | INTRAVENOUS | Status: AC | PRN
Start: 2017-11-25 — End: 2017-11-25
  Administered 2017-11-25: 100 mL via INTRAVENOUS

## 2017-12-02 ENCOUNTER — Ambulatory Visit
Admission: RE | Admit: 2017-12-02 | Discharge: 2017-12-02 | Disposition: A | Discharge status: Home / Self Care | Payer: Medicare Other | Source: Ambulatory Visit | Attending: Internal Medicine | Admitting: Internal Medicine

## 2017-12-02 DIAGNOSIS — Z1231 Encounter for screening mammogram for malignant neoplasm of breast: Secondary | ICD-10-CM | POA: Diagnosis not present

## 2017-12-23 DIAGNOSIS — K449 Diaphragmatic hernia without obstruction or gangrene: Secondary | ICD-10-CM | POA: Diagnosis not present

## 2017-12-23 DIAGNOSIS — I8500 Esophageal varices without bleeding: Secondary | ICD-10-CM | POA: Diagnosis not present

## 2017-12-23 DIAGNOSIS — K7460 Unspecified cirrhosis of liver: Secondary | ICD-10-CM | POA: Diagnosis not present

## 2018-01-11 DIAGNOSIS — R748 Abnormal levels of other serum enzymes: Secondary | ICD-10-CM | POA: Diagnosis not present

## 2018-01-19 DIAGNOSIS — R933 Abnormal findings on diagnostic imaging of other parts of digestive tract: Secondary | ICD-10-CM | POA: Diagnosis not present

## 2018-01-19 DIAGNOSIS — R748 Abnormal levels of other serum enzymes: Secondary | ICD-10-CM | POA: Diagnosis not present

## 2018-01-19 DIAGNOSIS — K7460 Unspecified cirrhosis of liver: Secondary | ICD-10-CM | POA: Diagnosis not present

## 2018-01-19 DIAGNOSIS — K760 Fatty (change of) liver, not elsewhere classified: Secondary | ICD-10-CM | POA: Diagnosis not present

## 2018-01-23 ENCOUNTER — Other Ambulatory Visit (HOSPITAL_COMMUNITY): Payer: Self-pay | Admitting: Gastroenterology

## 2018-01-23 DIAGNOSIS — K7460 Unspecified cirrhosis of liver: Secondary | ICD-10-CM

## 2018-01-23 DIAGNOSIS — R945 Abnormal results of liver function studies: Secondary | ICD-10-CM

## 2018-01-23 DIAGNOSIS — K760 Fatty (change of) liver, not elsewhere classified: Secondary | ICD-10-CM

## 2018-01-23 DIAGNOSIS — R7989 Other specified abnormal findings of blood chemistry: Secondary | ICD-10-CM

## 2018-01-31 ENCOUNTER — Other Ambulatory Visit: Payer: Self-pay | Admitting: Radiology

## 2018-02-01 ENCOUNTER — Ambulatory Visit (HOSPITAL_COMMUNITY)
Admission: RE | Admit: 2018-02-01 | Discharge: 2018-02-01 | Disposition: A | Discharge status: Home / Self Care | Payer: Medicare Other | Source: Ambulatory Visit | Attending: Gastroenterology | Admitting: Gastroenterology

## 2018-02-01 ENCOUNTER — Encounter (HOSPITAL_COMMUNITY): Payer: Self-pay

## 2018-02-01 DIAGNOSIS — Z79899 Other long term (current) drug therapy: Secondary | ICD-10-CM | POA: Diagnosis not present

## 2018-02-01 DIAGNOSIS — K739 Chronic hepatitis, unspecified: Secondary | ICD-10-CM | POA: Diagnosis not present

## 2018-02-01 DIAGNOSIS — R7989 Other specified abnormal findings of blood chemistry: Secondary | ICD-10-CM

## 2018-02-01 DIAGNOSIS — Z8042 Family history of malignant neoplasm of prostate: Secondary | ICD-10-CM | POA: Insufficient documentation

## 2018-02-01 DIAGNOSIS — Z820 Family history of epilepsy and other diseases of the nervous system: Secondary | ICD-10-CM | POA: Diagnosis not present

## 2018-02-01 DIAGNOSIS — R945 Abnormal results of liver function studies: Secondary | ICD-10-CM

## 2018-02-01 DIAGNOSIS — K7460 Unspecified cirrhosis of liver: Secondary | ICD-10-CM | POA: Insufficient documentation

## 2018-02-01 DIAGNOSIS — Z8249 Family history of ischemic heart disease and other diseases of the circulatory system: Secondary | ICD-10-CM | POA: Diagnosis not present

## 2018-02-01 DIAGNOSIS — Z7982 Long term (current) use of aspirin: Secondary | ICD-10-CM | POA: Diagnosis not present

## 2018-02-01 DIAGNOSIS — Z823 Family history of stroke: Secondary | ICD-10-CM | POA: Diagnosis not present

## 2018-02-01 DIAGNOSIS — Z8673 Personal history of transient ischemic attack (TIA), and cerebral infarction without residual deficits: Secondary | ICD-10-CM | POA: Insufficient documentation

## 2018-02-01 DIAGNOSIS — I10 Essential (primary) hypertension: Secondary | ICD-10-CM | POA: Insufficient documentation

## 2018-02-01 DIAGNOSIS — K732 Chronic active hepatitis, not elsewhere classified: Secondary | ICD-10-CM | POA: Diagnosis not present

## 2018-02-01 DIAGNOSIS — K760 Fatty (change of) liver, not elsewhere classified: Secondary | ICD-10-CM

## 2018-02-01 LAB — CBC WITH DIFFERENTIAL/PLATELET
BASOS ABS: 0 10*3/uL (ref 0.0–0.1)
Basophils Relative: 1 %
EOS PCT: 2 %
Eosinophils Absolute: 0.1 10*3/uL (ref 0.0–0.7)
HEMATOCRIT: 40.3 % (ref 36.0–46.0)
Hemoglobin: 13.8 g/dL (ref 12.0–15.0)
LYMPHS PCT: 26 %
Lymphs Abs: 1.7 10*3/uL (ref 0.7–4.0)
MCH: 28.8 pg (ref 26.0–34.0)
MCHC: 34.2 g/dL (ref 30.0–36.0)
MCV: 84 fL (ref 78.0–100.0)
MONO ABS: 0.7 10*3/uL (ref 0.1–1.0)
Monocytes Relative: 11 %
NEUTROS ABS: 4 10*3/uL (ref 1.7–7.7)
Neutrophils Relative %: 60 %
PLATELETS: 129 10*3/uL — AB (ref 150–400)
RBC: 4.8 MIL/uL (ref 3.87–5.11)
RDW: 14.1 % (ref 11.5–15.5)
WBC: 6.6 10*3/uL (ref 4.0–10.5)

## 2018-02-01 LAB — COMPREHENSIVE METABOLIC PANEL
ALT: 33 U/L (ref 14–54)
ANION GAP: 10 (ref 5–15)
AST: 40 U/L (ref 15–41)
Albumin: 4.1 g/dL (ref 3.5–5.0)
Alkaline Phosphatase: 207 U/L — ABNORMAL HIGH (ref 38–126)
BILIRUBIN TOTAL: 1 mg/dL (ref 0.3–1.2)
BUN: 16 mg/dL (ref 6–20)
CHLORIDE: 110 mmol/L (ref 101–111)
CO2: 21 mmol/L — ABNORMAL LOW (ref 22–32)
Calcium: 9.6 mg/dL (ref 8.9–10.3)
Creatinine, Ser: 0.72 mg/dL (ref 0.44–1.00)
GFR calc Af Amer: >60 mL/min (ref >60)
Glucose, Bld: 92 mg/dL (ref 65–99)
POTASSIUM: 4 mmol/L (ref 3.5–5.1)
Sodium: 141 mmol/L (ref 135–145)
TOTAL PROTEIN: 7.7 g/dL (ref 6.5–8.1)

## 2018-02-01 LAB — PROTIME-INR
INR: 1.07
PROTHROMBIN TIME: 13.9 s (ref 11.4–15.2)

## 2018-02-01 MED ORDER — HYDROCODONE-ACETAMINOPHEN 5-325 MG PO TABS
ORAL_TABLET | ORAL | Status: AC
  Filled 2018-02-01: qty 2

## 2018-02-01 MED ORDER — SODIUM CHLORIDE 0.9 % IV SOLN
INTRAVENOUS | Status: DC
  Administered 2018-02-01: 11:00:00 via INTRAVENOUS

## 2018-02-01 MED ORDER — HYDROCODONE-ACETAMINOPHEN 5-325 MG PO TABS
2.0000 | ORAL_TABLET | Freq: Once | ORAL | Status: AC
  Administered 2018-02-01: 2 via ORAL

## 2018-02-01 MED ORDER — LIDOCAINE HCL 1 % IJ SOLN
INTRAMUSCULAR | Status: AC
  Filled 2018-02-01: qty 10

## 2018-02-01 MED ORDER — FENTANYL CITRATE (PF) 100 MCG/2ML IJ SOLN
INTRAMUSCULAR | Status: AC | PRN
  Administered 2018-02-01 (×2): 50 ug via INTRAVENOUS

## 2018-02-01 MED ORDER — FENTANYL CITRATE (PF) 100 MCG/2ML IJ SOLN
INTRAMUSCULAR | Status: AC
  Filled 2018-02-01: qty 2

## 2018-02-01 MED ORDER — MIDAZOLAM HCL 2 MG/2ML IJ SOLN
INTRAMUSCULAR | Status: AC
  Filled 2018-02-01: qty 2

## 2018-02-01 MED ORDER — LIDOCAINE HCL (PF) 1 % IJ SOLN
INTRAMUSCULAR | Status: AC | PRN
  Administered 2018-02-01: 30 mL

## 2018-02-01 MED ORDER — MIDAZOLAM HCL 2 MG/2ML IJ SOLN
INTRAMUSCULAR | Status: AC | PRN
  Administered 2018-02-01 (×2): 1 mg via INTRAVENOUS

## 2018-02-01 NOTE — Procedures (Signed)
Random liver core Bx 18 g times four EBL 0 Comp 0

## 2018-02-01 NOTE — Progress Notes (Signed)
Patient ID: Terri Turner, female   DOB: Jul 19, 1946, 72 y.o.   MRN: [id number] Pt remains stable; feels better after recent vicodin;  VSS; discharge instructions reviewed with pt/daughter; if symptoms were to worsen told them to contact IR on call MD or report to ED.

## 2018-02-01 NOTE — Progress Notes (Addendum)
Pt s/p uncomplicated random rt hepatic lobe liver bx today; immediate post images revealed no hemorrhage; gelfoam not used; now with RUQ pain BP 150/80  HR 61; afebrile Puncture site RUQ clean and dry, mod tender, abd soft   Will give norco for pain; monitor closely ; if pain does not improve, will obtain CT abd; plan d/w pt and Dr. Herold Harms [facility]

## 2018-02-01 NOTE — Discharge Instructions (Signed)
Moderate Conscious Sedation, Adult, Care After These instructions provide you with information about caring for yourself after your procedure. Your health care provider may also give you more specific instructions. Your treatment has been planned according to current medical practices, but problems sometimes occur. Call your health care provider if you have any problems or questions after your procedure. What can I expect after the procedure? After your procedure, it is common:  To feel sleepy for several hours.  To feel clumsy and have poor balance for several hours.  To have poor judgment for several hours.  To vomit if you eat too soon.  Follow these instructions at home: For at least 24 hours after the procedure:   Do not: ? Participate in activities where you could fall or become injured. ? Drive. ? Use heavy machinery. ? Drink alcohol. ? Take sleeping pills or medicines that cause drowsiness. ? Make important decisions or sign legal documents. ? Take care of children on your own.  Rest. Eating and drinking  Follow the diet recommended by your health care provider.  If you vomit: ? Drink water, juice, or soup when you can drink without vomiting. ? Make sure you have little or no nausea before eating solid foods. General instructions  Have a responsible adult stay with you until you are awake and alert.  Take over-the-counter and prescription medicines only as told by your health care provider.  If you smoke, do not smoke without supervision.  Keep all follow-up visits as told by your health care provider. This is important. Contact a health care provider if:  You keep feeling nauseous or you keep vomiting.  You feel light-headed.  You develop a rash.  You have a fever. Get help right away if:  You have trouble breathing. This information is not intended to replace advice given to you by your health care provider. Make sure you discuss any questions you have  with your health care provider. Document Released: 09/26/2013 Document Revised: 05/10/2016 Document Reviewed: 03/27/2016 Elsevier Interactive Patient Education  2018 [employer].   Liver Biopsy, Care After These instructions give you information on caring for yourself after your procedure. Your doctor may also give you more specific instructions. Call your doctor if you have any problems or questions after your procedure. Follow these instructions at home:  Rest at home for 1-2 days or as told by your doctor.  Have someone stay with you for at least 24 hours.  Do not do these things in the first 24 hours: ? Drive. ? Use machinery. ? Take care of other people. ? Sign legal documents. ? Take a bath or shower.  There are many different ways to close and cover a cut (incision). For example, a cut can be closed with stitches, skin glue, or adhesive strips. Follow your doctor's instructions on: ? Taking care of your cut. ? Changing and removing your bandage (dressing).  You may remove your dressing tomorrow.  You may shower tomorrow. ? Removing whatever was used to close your cut.  Do not drink alcohol in the first week.  Do not lift more than 5 pounds or play contact sports for the first 2 weeks.  Take medicines only as told by your doctor. For 1 week, do not take medicine that has aspirin in it unless directed by your doctor.  Get your test results. Contact a doctor if:  A cut bleeds and leaves more than just a small spot of blood.  A cut is red,  puffs up (swells), or hurts more than before.  Fluid or something else comes from a cut.  A cut smells bad.  You have a fever or chills. Get help right away if:  You have swelling, bloating, or pain in your belly (abdomen).  You get dizzy or faint.  You have a rash.  You feel sick to your stomach (nauseous) or throw up (vomit).  You have trouble breathing, feel short of breath, or feel faint.  Your chest hurts.  You  have problems talking or seeing.  You have trouble balancing or moving your arms or legs. This information is not intended to replace advice given to you by your health care provider. Make sure you discuss any questions you have with your health care provider. Document Released: 09/14/2008 Document Revised: 05/13/2016 Document Reviewed: 02/01/2014 Elsevier Interactive Patient Education  [employer].

## 2018-02-01 NOTE — Progress Notes (Signed)
Patient ID: Terri Turner, female   DOB: [birth year], 72 y.o.   MRN: [id number] Pt asleep; VSS; abd pain level recorded as 4/10; cont to monitor

## 2018-02-01 NOTE — Consult Note (Signed)
Chief Complaint: Patient was seen in consultation today for ultrasound-guided random core liver biopsy  Referring Physician(s): Mann,Jyothi  Supervising Physician: Marybelle Killings  Patient Status: [facility] - Out-pt  History of Present Illness: Terri Turner is a 72 y.o. female with history of elevated liver function tests and fatty liver/cirrhosis by imaging who presents today for ultrasound-guided random core liver biopsy for further evaluation.  Past Medical History:  Diagnosis Date  . Headache   . Heart murmur    not treated..  . Hypertension   . Stroke [facility])     Past Surgical History:  Procedure Laterality Date  . BREAST CYST ASPIRATION Right   . CHOLECYSTECTOMY    . HYSTEROSCOPY W/D&C N/A 12/29/2015   Procedure: DILATATION AND CURETTAGE /HYSTEROSCOPY;  Surgeon: Janyth Pupa, DO;  Location: [facility];  Service: Gynecology;  Laterality: N/A;  . URINARY SURGERY     mesh   . WRIST SURGERY     left thumb (pin)    Allergies: Patient has no known allergies.  Medications: Prior to Admission medications   Medication Sig Start Date End Date Taking? Authorizing Provider  acetaminophen (TYLENOL) 325 MG tablet Take 650 mg by mouth every 6 (six) hours as needed.    [provider]  aspirin 325 MG tablet Take 1 tablet (325 mg total) by mouth daily. 06/04/16   Reyne Dumas, MD  atorvastatin (LIPITOR) 10 MG tablet Take 1 tablet (10 mg total) by mouth daily. 05/23/17   Rosalin Hawking, MD  Butalbital-APAP-Caffeine 24-300-40 MG CAPS  12/21/16   [provider]  Cholecalciferol (VITAMIN D) 2000 units CAPS Take 2,000 Units by mouth daily.    [provider]  Coenzyme Q10 (CO Q 10 PO) Take 1 tablet by mouth daily.    [provider]  GINKGO BILOBA PO Take 2 capsules by mouth daily.     [provider]  nortriptyline (PAMELOR) 25 MG capsule Take 1 capsule (25 mg total) by mouth at bedtime. Take 25mg  after 7 days of 10mg  dose. 05/19/17   Rosalin Hawking, MD    Omega-3 Fatty Acids (FISH OIL PO) Take 1 capsule by mouth daily.    [provider]  telmisartan (MICARDIS) 80 MG tablet Take by mouth. 11/04/15   [provider]     Family History  Problem Relation Age of Onset  . Hypertension Mother   . Stroke Father   . Prostate cancer Brother   . Hypertension Sister   . High blood pressure Sister   . Alzheimer's disease Sister     Social History   Socioeconomic History  . Marital status: Married    Spouse name: Not on file  . Number of children: Not on file  . Years of education: Not on file  . Highest education level: Not on file  Social Needs  . Financial resource strain: Not on file  . Food insecurity - worry: Not on file  . Food insecurity - inability: Not on file  . Transportation needs - medical: Not on file  . Transportation needs - non-medical: Not on file  Occupational History  . Not on file  Tobacco Use  . Smoking status: Never Smoker  . Smokeless tobacco: Never Used  Substance and Sexual Activity  . Alcohol use: No  . Drug use: No  . Sexual activity: Not Currently  Other Topics Concern  . Not on file  Social History Narrative  . Not on file      Review of Systems currently  denies fever, headache, chest pain, dyspnea, back pain, nausea, vomiting or bleeding.  She does have occasional right upper quadrant "aching", occasional cough and weight loss.   Vital Signs: Vitals:   02/01/18 1059  BP: (!) 148/88  Pulse: 65  Resp: 16  Temp: 98.3 F (36.8 C)  SpO2: 99%      Physical Exam awake, alert.  Chest clear to auscultation bilaterally.  Heart with regular rate and rhythm.  Abdomen soft, positive bowel sounds, currently nontender.  No lower extremity edema.  Imaging: No results found.  Labs:  CBC: No results for input(s): WBC, HGB, HCT, PLT in the last 8760 hours.  COAGS: No results for input(s): INR, APTT in the last 8760 hours.  BMP: No results for input(s): NA, K, CL, CO2,  GLUCOSE, BUN, CALCIUM, CREATININE, GFRNONAA, GFRAA in the last 8760 hours.  Invalid input(s): CMP  LIVER FUNCTION TESTS: No results for input(s): BILITOT, AST, ALT, ALKPHOS, PROT, ALBUMIN in the last 8760 hours.  TUMOR MARKERS: No results for input(s): AFPTM, CEA, CA199, CHROMGRNA in the last 8760 hours.  Assessment and Plan:  72 y.o. female with history of elevated liver function tests and fatty liver/cirrhosis by imaging who presents today for ultrasound-guided random core liver biopsy for further evaluation.Risks and benefits discussed with the patient including, but not limited to bleeding, infection, damage to adjacent structures or low yield requiring additional tests.  All of the patient's questions were answered, patient is agreeable to proceed. Consent signed and in chart.  Labs pending   Thank you for this interesting consult.  I greatly enjoyed meeting Terri Turner and look forward to participating in their care.  A copy of this report was sent to the requesting provider on this date.  Electronically Signed: D. Rowe Robert, PA-C 02/01/2018, 10:55 AM   I spent a total of  25 minutes   in face to face in clinical consultation, greater than 50% of which was counseling/coordinating care for ultrasound-guided random core liver biopsy

## 2018-04-11 DIAGNOSIS — J111 Influenza due to unidentified influenza virus with other respiratory manifestations: Secondary | ICD-10-CM | POA: Diagnosis not present

## 2018-04-11 DIAGNOSIS — R05 Cough: Secondary | ICD-10-CM | POA: Diagnosis not present

## 2018-04-17 DIAGNOSIS — R748 Abnormal levels of other serum enzymes: Secondary | ICD-10-CM | POA: Diagnosis not present

## 2018-04-17 DIAGNOSIS — E039 Hypothyroidism, unspecified: Secondary | ICD-10-CM | POA: Diagnosis not present

## 2018-04-17 DIAGNOSIS — J111 Influenza due to unidentified influenza virus with other respiratory manifestations: Secondary | ICD-10-CM | POA: Diagnosis not present

## 2018-04-17 DIAGNOSIS — R0789 Other chest pain: Secondary | ICD-10-CM | POA: Diagnosis not present

## 2018-04-18 ENCOUNTER — Ambulatory Visit: Payer: Medicare Other | Admitting: Neurology

## 2018-05-02 ENCOUNTER — Ambulatory Visit (INDEPENDENT_AMBULATORY_CARE_PROVIDER_SITE_OTHER): Payer: Medicare Other | Admitting: Neurology

## 2018-05-02 ENCOUNTER — Encounter: Payer: Self-pay | Admitting: Neurology

## 2018-05-02 VITALS — BP 126/70 | HR 67 | Ht 61.0 in | Wt 128.8 lb

## 2018-05-02 DIAGNOSIS — G459 Transient cerebral ischemic attack, unspecified: Secondary | ICD-10-CM | POA: Diagnosis not present

## 2018-05-02 DIAGNOSIS — I10 Essential (primary) hypertension: Secondary | ICD-10-CM

## 2018-05-02 DIAGNOSIS — G43009 Migraine without aura, not intractable, without status migrainosus: Secondary | ICD-10-CM | POA: Diagnosis not present

## 2018-05-02 DIAGNOSIS — G44209 Tension-type headache, unspecified, not intractable: Secondary | ICD-10-CM | POA: Diagnosis not present

## 2018-05-02 NOTE — Progress Notes (Signed)
NEUROLOGY CLINIC NEW PATIENT NOTE  NAME: Terri Turner DOB: 09-20-1946 REFERRING PHYSICIAN: Thressa Sheller, MD  I saw Terri Turner as a new consult in the neurovascular clinic today.  HPI: Terri Turner is a 72 y.o. female with PMH of HTN, HA, and TIA who presents as a new patient for HA.   Pt stated that she has had migraine for 20 years. Whole head hurts, sharp pain, constant, may last for a week, happens twice a month. She was seen by her neurologist in [place] at that time and was put on fioricet daily dose. It worked for a while and she did feel better. She eventually weaned off fioricet. She occasionally use tylenol also for the HA.   She was admitted in 05/2016 for 2 episodes of left sided weakness and difficulty speaking, lasting 15-18min. MRI and CT negative for stroke. MRA head, CUS negative and 2D EF 60-65%. LDL 111 and A1C 5.5. She is on ASA and lipitor.   However, since the TIA, her HA seems getting worse. Seems more frequent and HA more at the back of eyes, feels pressure around eyes, voice funny, photo and phonophobia. The HA more head band like around the frontal and temporal region, feels tight, associated with neck tightness and neck discomfort, some burning sensation at the top of head. Her tinnitus got worse too. She has tinnitus for 20 years in both ears, was treated before with phenergan. she recently she went to see ENT and was told some hearing loss but no significant abnormalities. For the last 4 weeks, her HA getting worse and worse, almost everyday, sometimes really bad, and she started to take fioricet again one week ago. She has not been giving any HA preventive medication before.   She admitted that she has recent stress at home. She feels she is more easily irritated and more angry than before. She was using estrogen supplement with vaginal ring, which was removed in 02/2016. She has Hx of HTN on BP meds at home. Today BP 137/79.   04/06/17 follow up - pt has  been doing well. Stated that her HA much improved after nortriptyline. But sometimes still has heaviness in the afternoon, likely due to stress and anxiety. Has some good days and bad days. However, she feels nortriptyline makes her wake up more frequently at night and she can not get sound sleep, but she does not want to d/c nortriptyline as she is afraid HA will come back. She use phenergan PRN and it helps her sleep. She use tylenol and fioricet for acute HA and effective. On ASA and lipitor without side effects. Admitted that she still has a lot of stress and anxious about a lot of things. BP today 115/77.  10/17/18 follow up - pt has been doing well from stroke standpoint. Still has intermittent tension HA as her husband has been diagnosed with lymphoma and she has to take care of him. She also has right middle finger pain on flexion, needs to follow up with hand surgeon. On nortriptyline, helped for HA and sleep. Melatonin has been discontinued by herself. BP today 116/72 in clinic.   Interval history During the interval time, pt has been doing well. She was found to have elevated AKP and all her medication was currently on hold, including Lipitor and aspirin.  She also had needle liver biopsy showed possible cirrhosis.  However, her headache was not worsening without Tylenol, Fioricet or nortriptyline.  Actually, the headache was improving.  She still has a lot of stress with her husband health condition, but currently stable.  BP today 126/70.  Past Medical History:  Diagnosis Date  . Headache   . Heart murmur    not treated..  . Hypertension   . Stroke [facility])    Past Surgical History:  Procedure Laterality Date  . BREAST CYST ASPIRATION Right   . CHOLECYSTECTOMY    . HYSTEROSCOPY W/D&C N/A 12/29/2015   Procedure: DILATATION AND CURETTAGE /HYSTEROSCOPY;  Surgeon: Janyth Pupa, DO;  Location: [facility];  Service: Gynecology;  Laterality: N/A;  . URINARY SURGERY     mesh   . WRIST SURGERY       left thumb (pin)   Family History  Problem Relation Age of Onset  . Hypertension Mother   . Stroke Father   . Prostate cancer Brother   . Hypertension Sister   . High blood pressure Sister   . Alzheimer's disease Sister    Current Outpatient Medications  Medication Sig Dispense Refill  . acetaminophen (TYLENOL) 325 MG tablet Take 650 mg by mouth every 6 (six) hours as needed.    Marland Kitchen aspirin 325 MG tablet Take 1 tablet (325 mg total) by mouth daily. (Patient not taking: Reported on 05/02/2018) 30 tablet 1  . atorvastatin (LIPITOR) 10 MG tablet Take 1 tablet (10 mg total) by mouth daily. (Patient not taking: Reported on 05/02/2018) 90 tablet 1  . Butalbital-APAP-Caffeine 50-300-40 MG CAPS     . Cholecalciferol (VITAMIN D) 2000 units CAPS Take 2,000 Units by mouth daily.    . Coenzyme Q10 (CO Q 10 PO) Take 1 tablet by mouth daily.    Marland Kitchen GINKGO BILOBA PO Take 2 capsules by mouth daily.     . nortriptyline (PAMELOR) 25 MG capsule Take 1 capsule (25 mg total) by mouth at bedtime. Take 25mg  after 7 days of 10mg  dose. (Patient not taking: Reported on 05/02/2018) 90 capsule 2  . Omega-3 Fatty Acids (FISH OIL PO) Take 1 capsule by mouth daily.    Marland Kitchen telmisartan (MICARDIS) 80 MG tablet Take by mouth.     No current facility-administered medications for this visit.    No Known Allergies Social History   Socioeconomic History  . Marital status: Married    Spouse name: Not on file  . Number of children: Not on file  . Years of education: Not on file  . Highest education level: Not on file  Occupational History  . Not on file  Social Needs  . Financial resource strain: Not on file  . Food insecurity:    Worry: Not on file    Inability: Not on file  . Transportation needs:    Medical: Not on file    Non-medical: Not on file  Tobacco Use  . Smoking status: Never Smoker  . Smokeless tobacco: Never Used  Substance and Sexual Activity  . Alcohol use: No  . Drug use: No  . Sexual activity:  Not Currently  Lifestyle  . Physical activity:    Days per week: Not on file    Minutes per session: Not on file  . Stress: Not on file  Relationships  . Social connections:    Talks on phone: Not on file    Gets together: Not on file    Attends religious service: Not on file    Active member of club or organization: Not on file    Attends meetings of clubs or organizations: Not on file    Relationship  status: Not on file  . Intimate partner violence:    Fear of current or ex partner: Not on file    Emotionally abused: Not on file    Physically abused: Not on file    Forced sexual activity: Not on file  Other Topics Concern  . Not on file  Social History Narrative  . Not on file    Review of Systems Full 14 system review of systems performed and notable only for those listed, all others are neg:  Constitutional:   Cardiovascular:  Ear/Nose/Throat:  ringing in ears Skin:  Eyes:   Respiratory: Cough, choking Gastroitestinal:   Genitourinary:  Hematology/Lymphatic:   Endocrine:  Musculoskeletal:   Allergy/Immunology:   Neurological:  HA, numbness Psychiatric:  Sleep: frequent waking   Physical Exam  Vitals:   05/02/18 1023  BP: 126/70  Pulse: 67    General - Well nourished, well developed, in no apparent distress.  Ophthalmologic - Sharp disc margins OU.  Cardiovascular - Regular rate and rhythm with no murmur.   Neck - supple, no nuchal rigidity .  Mental Status -  Level of arousal and orientation to time, place, and person were intact. Language including expression, naming, repetition, comprehension, reading, and writing was assessed and found intact. Fund of Knowledge was assessed and was intact.  Cranial Nerves II - XII - II - Visual field intact OU. III, IV, VI - Extraocular movements intact. V - Facial sensation intact bilaterally. VII - Facial movement intact bilaterally. VIII - Hearing & vestibular intact bilaterally. X - Palate elevates  symmetrically. XI - Chin turning & shoulder shrug intact bilaterally. XII - Tongue protrusion intact.  Motor Strength - The patient's strength was normal in all extremities and pronator drift was absent.  Bulk was normal and fasciculations were absent.   Motor Tone - Muscle tone was assessed at the neck and appendages and was normal.  Reflexes - The patient's reflexes were normal in all extremities and she had no pathological reflexes.  Sensory - Light touch, temperature/pinprick, vibration and proprioception, and Romberg testing were assessed and were normal.    Coordination - The patient had normal movements in the hands and feet with no ataxia or dysmetria.  Tremor was absent.  Gait and Station - The patient's transfers, posture, gait, station, and turns were observed as normal.   Imaging  I have personally reviewed the radiological images below and agree with the radiology interpretations.  06/04/16 MRI and MRA head Moderate chronic microvascular ischemic change in the white matter. No acute infarct Negative MRA circle of Willis.  Lab Review Component     Latest Ref Rng & Units 06/04/2016  Cholesterol     0 - 200 mg/dL 191  Triglycerides     <150 mg/dL 85  HDL Cholesterol     >40 mg/dL 63  Total CHOL/HDL Ratio     RATIO 3.0  VLDL     0 - 40 mg/dL 17  LDL (calc)     0 - 99 mg/dL 111 (H)  Hemoglobin A1C     4.8 - 5.6 % 5.5  Mean Plasma Glucose     mg/dL 111     Assessment:   In summary, Terri Turner is a 72 y.o. female with PMH of HTN, TIA and migraine HA presents for worsening HA. She has migraine and was treated with fioricet in the past. Had TIA in 05/2016 but since then the HA getting worse. However, the HA by description more consistent  with tension HA on top of her migraine. She admitted recent stress and anxious all the time. Prescribe nortriptyline for HA prevention and felt better. Continue ASA and lipitor for stroke prevention. Right middle finger pain on  flexion, following with hand surgeon. She was found to have elevated AKP and all her medication was put on hold, including Lipitor and aspirin.  She also had needle liver biopsy showed possible cirrhosis.  However, her headache was not worsening without Tylenol, Fioricet or nortriptyline.  Actually, the headache was improving.    Plan: - follow up with PCP and GI for a patient that the last few abnormal AKP - if appropriate, may consider to resume ASA 81mg  in the setting of TIA history - continue to observe for HA  - relaxation and de-stress and avoid unpleasant sound and light.  - Follow up with your primary care physician for stroke risk factor modification. Recommend maintain blood pressure goal <130/80, diabetes with hemoglobin A1c goal below 7.0% and lipids with LDL cholesterol goal below 70 mg/dL.  - check BP at home.   - follow up in 6 months with Dr. Krista Blue (pt husband is Dr. Rhea Belton pt and they love Dr. Krista Blue)  I spent more than 25 minutes of face to face time with the patient. Greater than 50% of time was spent in counseling and coordination of care.    No orders of the defined types were placed in this encounter.   No orders of the defined types were placed in this encounter.   Patient Instructions  - follow up with PCP and GI for follow up of abnormal AKP - if appropriate, may consider to resume ASA 81mg  in the setting of TIA history - continue to observe for HA  - relaxation and de-stress and avoid unpleasant sound and light.  - Follow up with your primary care physician for stroke risk factor modification. Recommend maintain blood pressure goal <130/80, diabetes with hemoglobin A1c goal below 7.0% and lipids with LDL cholesterol goal below 70 mg/dL.  - check BP at home.   - follow up in 6 months with Dr. Krista Blue.     Rosalin Hawking, MD PhD [facility] [address] [place], [place] [address] [phone]

## 2018-05-02 NOTE — Patient Instructions (Addendum)
-   follow up with PCP and GI for follow up of abnormal AKP - if appropriate, may consider to resume ASA 81mg  in the setting of TIA history - continue to observe for HA  - relaxation and de-stress and avoid unpleasant sound and light.  - Follow up with your primary care physician for stroke risk factor modification. Recommend maintain blood pressure goal <130/80, diabetes with hemoglobin A1c goal below 7.0% and lipids with LDL cholesterol goal below 70 mg/dL.  - check BP at home.   - follow up in 6 months with Dr. Krista Blue.

## 2018-05-04 DIAGNOSIS — I8500 Esophageal varices without bleeding: Secondary | ICD-10-CM | POA: Diagnosis not present

## 2018-05-04 DIAGNOSIS — R748 Abnormal levels of other serum enzymes: Secondary | ICD-10-CM | POA: Diagnosis not present

## 2018-05-04 DIAGNOSIS — D696 Thrombocytopenia, unspecified: Secondary | ICD-10-CM | POA: Diagnosis not present

## 2018-05-04 DIAGNOSIS — Z8679 Personal history of other diseases of the circulatory system: Secondary | ICD-10-CM | POA: Diagnosis not present

## 2018-05-11 DIAGNOSIS — G4700 Insomnia, unspecified: Secondary | ICD-10-CM | POA: Diagnosis not present

## 2018-05-11 DIAGNOSIS — R05 Cough: Secondary | ICD-10-CM | POA: Diagnosis not present

## 2018-05-11 DIAGNOSIS — Z8679 Personal history of other diseases of the circulatory system: Secondary | ICD-10-CM | POA: Diagnosis not present

## 2018-05-11 DIAGNOSIS — G43909 Migraine, unspecified, not intractable, without status migrainosus: Secondary | ICD-10-CM | POA: Diagnosis not present

## 2018-05-25 ENCOUNTER — Ambulatory Visit (INDEPENDENT_AMBULATORY_CARE_PROVIDER_SITE_OTHER): Payer: Medicare Other | Admitting: Internal Medicine

## 2018-05-25 ENCOUNTER — Encounter: Payer: Self-pay | Admitting: Internal Medicine

## 2018-05-25 ENCOUNTER — Other Ambulatory Visit (INDEPENDENT_AMBULATORY_CARE_PROVIDER_SITE_OTHER): Payer: Medicare Other

## 2018-05-25 ENCOUNTER — Ambulatory Visit (INDEPENDENT_AMBULATORY_CARE_PROVIDER_SITE_OTHER)
Admission: RE | Admit: 2018-05-25 | Discharge: 2018-05-25 | Disposition: A | Discharge status: Home / Self Care | Payer: Medicare Other | Source: Ambulatory Visit | Attending: Internal Medicine | Admitting: Internal Medicine

## 2018-05-25 VITALS — BP 142/80 | HR 76 | Ht 61.0 in | Wt 128.8 lb

## 2018-05-25 DIAGNOSIS — R05 Cough: Secondary | ICD-10-CM

## 2018-05-25 DIAGNOSIS — I10 Essential (primary) hypertension: Secondary | ICD-10-CM | POA: Diagnosis not present

## 2018-05-25 DIAGNOSIS — R058 Other specified cough: Secondary | ICD-10-CM

## 2018-05-25 LAB — CBC WITH DIFFERENTIAL/PLATELET
BASOS PCT: 1.3 % (ref 0.0–3.0)
Basophils Absolute: 0.1 10*3/uL (ref 0.0–0.1)
Eosinophils Absolute: 0.2 10*3/uL (ref 0.0–0.7)
Eosinophils Relative: 3.4 % (ref 0.0–5.0)
HEMATOCRIT: 40.4 % (ref 36.0–46.0)
Hemoglobin: 13.6 g/dL (ref 12.0–15.0)
LYMPHS ABS: 1.8 10*3/uL (ref 0.7–4.0)
Lymphocytes Relative: 27 % (ref 12.0–46.0)
MCHC: 33.7 g/dL (ref 30.0–36.0)
MCV: 84.9 fl (ref 78.0–100.0)
Monocytes Absolute: 0.9 10*3/uL (ref 0.1–1.0)
Monocytes Relative: 14.1 % — ABNORMAL HIGH (ref 3.0–12.0)
NEUTROS ABS: 3.6 10*3/uL (ref 1.4–7.7)
NEUTROS PCT: 54.2 % (ref 43.0–77.0)
PLATELETS: 120 10*3/uL — AB (ref 150.0–400.0)
RBC: 4.76 Mil/uL (ref 3.87–5.11)
RDW: 15.2 % (ref 11.5–15.5)
WBC: 6.6 10*3/uL (ref 4.0–10.5)

## 2018-05-25 MED ORDER — PREDNISONE 10 MG PO TABS: ORAL_TABLET | ORAL | 0 refills | Status: DC

## 2018-05-25 MED ORDER — ACETAMINOPHEN-CODEINE #3 300-30 MG PO TABS: 1.0000 | ORAL_TABLET | ORAL | 0 refills | Status: DC | PRN

## 2018-05-25 MED ORDER — PANTOPRAZOLE SODIUM 40 MG PO TBEC: 40.0000 mg | DELAYED_RELEASE_TABLET | Freq: Every day | ORAL | 2 refills | Status: DC

## 2018-05-25 MED ORDER — FAMOTIDINE 20 MG PO TABS: ORAL_TABLET | ORAL | 11 refills | Status: DC

## 2018-05-25 NOTE — Patient Instructions (Addendum)
Pantoprazole (protonix) 40 mg   Take  30-60 min before first meal of the day and Pepcid (famotidine)  20 mg one @  bedtime until return to office - this is the best way to tell whether stomach acid is contributing to your problem.     GERD (REFLUX)  is an extremely common cause of respiratory symptoms just like yours , many times with no obvious heartburn at all.    It can be treated with medication, but also with lifestyle changes including elevation of the head of your bed (ideally with 6 inch  bed blocks),  Smoking cessation, avoidance of late meals, excessive alcohol, and avoid fatty foods, chocolate, peppermint, colas, red wine, and acidic juices such as orange juice.  NO MINT OR MENTHOL PRODUCTS SO NO COUGH DROPS   USE SUGARLESS CANDY INSTEAD (Jolley ranchers or Stover's or Life Savers) or even ice chips will also do - the key is to swallow to prevent all throat clearing. NO OIL BASED VITAMINS - use powdered substitutes.  For drainage / throat tickle try take CHLORPHENIRAMINE  4 mg - take one every 4 hours as needed - available over the counter- may cause drowsiness so start with just a bedtime dose or two and see how you tolerate it before trying in daytime     THEN IF STILL  coughing ok to use Tylenol #3 up to 2 every 4 hours if needed   Please remember to go to the lab and x-ray department downstairs in the basement  for your tests - we will call you with the results when they are available. Add:  Prednisone 10 mg take  4 each am x 2 days,   2 each am x 2 days,  1 each am x 2 days and stop       Please schedule a follow up office visit in 4 weeks, sooner if needed

## 2018-05-25 NOTE — Progress Notes (Signed)
Subjective:     Patient ID: Terri Turner, female   DOB: 28-Sep-1946,     MRN: [id number]  HPI  72 yo PR never smoker  m[facility] age 23 with Rheumatic fever age 38 so not very active but no resp problems at all even with colds  until march 2019 with flu-like illness abrupt onset st, ha, runny eyes / hurt all over/ some chills but no rigor or fever and all resolved  Except the cough so referred to pulmonary clinic 05/25/2018 by Dr  Deland Pretty p multiple ov's s response    05/25/2018 1st [facility] office visit/ Jaxn Chiquito   Chief Complaint  Patient presents with  . Pulmonary Consult    Referred by Dr. Deland Pretty. Pt c/o cough for the past 2-3 months. She states she coughs until she is gagging and coughs up clear sputum.  She states her cough is worse at night and occ wakes her up.    onset was acute with flu like illness late March 2019 all symptoms resolved x for cough  Tessalon pearls no better, rob dm no better / so far  no pred/ no inhalers or gerd rx yet as far as she can remember      Kouffman Reflux v Neurogenic Cough Differentiator Reflux Comments  Do you awaken from a sound sleep coughing violently?                            With trouble breathing? Yes all night     Do you have choking episodes when you cannot  Get enough air, gasping for air ?              Yes   Do you usually cough when you lie down into  The bed, or when you just lie down to rest ?                          Yes   Do you usually cough after meals or eating?         no   Do you cough when (or after) you bend over?    no   GERD SCORE     Kouffman Reflux v Neurogenic Cough Differentiator Neurogenic   Do you more-or-less cough all day long? Sporadic    Does change of temperature make you cough? Yes hot to cold is the worst    Does laughing or chuckling cause you to cough? yes   Do fumes (perfume, automobile fumes, burned  Toast, etc.,) cause you to cough ?      yes   Does speaking, singing, or talking on  the phone cause you to cough   ?               yes   Neurogenic/Airway score            NO other  Obvious patterns in  day to day or daytime variability or assoc truly  excess/ purulent sputum or mucus plugs or hemoptysis or cp or chest tightness, subjective wheeze or overt sinus or hb symptoms. No unusual exposure hx or h/o childhood pna/ asthma or knowledge of premature birth.  Sleeping ok flat without nocturnal  or early am exacerbation  of respiratory  c/o's or need for noct saba. Also denies any obvious fluctuation of symptoms with weather or environmental changes or other aggravating or alleviating factors except  as outlined above   Current Allergies, Complete Past Medical History, Past Surgical History, Family History, and Social History were reviewed in [employer] record.  ROS  The following are not active complaints unless bolded Hoarseness, sore throat, dysphagia, dental problems, itching, sneezing,  nasal congestion or discharge of excess mucus or purulent secretions, ear ache,   fever, chills, sweats, unintended wt loss or wt gain, classically pleuritic or exertional cp,  orthopnea pnd or leg swelling, presyncope, palpitations, abdominal pain, anorexia, nausea, vomiting, diarrhea  or change in bowel habits or change in bladder habits, change in stools or change in urine, dysuria, hematuria,  rash, arthralgias, visual complaints, headache, numbness, weakness or ataxia or problems with walking or coordination,  change in mood/affect or memory.        Current Meds  Medication Sig  . acetaminophen (TYLENOL) 325 MG tablet Take 650 mg by mouth every 6 (six) hours as needed.  Marland Kitchen aspirin EC 81 MG tablet Take 81 mg by mouth daily.  Marland Kitchen FIBER COMPLETE TABS Take 2 tablets by mouth daily.  . nadolol (CORGARD) 40 MG tablet Take 40 mg by mouth daily.  . nortriptyline (PAMELOR) 25 MG capsule Take 25 mg by mouth at bedtime.             Review of Systems      Objective:   Physical Exam    amb slt hoarse wf nad  Wt Readings from Last 3 Encounters:  05/25/18 128 lb 12.8 oz (58.4 kg)  05/02/18 128 lb 12.8 oz (58.4 kg)  02/01/18 130 lb (59 kg)     Vital signs reviewed - Note on arrival 02 sats  95% on RA      HEENT: nl dentition, turbinates bilaterally, and oropharynx. Nl external ear canals without cough reflex   NECK :  without JVD/Nodes/TM/ nl carotid upstrokes bilaterally   LUNGS: no acc muscle use,  Nl contour chest which is clear to A and P bilaterally without cough on insp or exp maneuvers   CV:  RRR  no s3 or murmur or increase in P2, and no edema   ABD:  soft and nontender with nl inspiratory excursion in the supine position. No bruits or organomegaly appreciated, bowel sounds nl  MS:  Nl gait/ ext warm without deformities, calf tenderness, cyanosis or clubbing No obvious joint restrictions   SKIN: warm and dry without lesions    NEURO:  alert, approp, nl sensorium with  no motor or cerebellar deficits apparent.    CXR PA and Lateral:   05/25/2018 :    I personally reviewed images and  impression as follows: Mild kyphosis, min increased markings/ non-specific       Labs ordered 05/25/2018  Allergy profile    Assessment:

## 2018-05-25 NOTE — Assessment & Plan Note (Addendum)
Onset abrupt p viral Terri Turner 2019 -  Allergy profile 05/25/2018 >  Eos 0.2 /  IgE  Pending    Of the three most common causes of  Sub-acute / recurrent or chronic cough, only one (GERD)  can actually contribute to/ trigger  the other two (asthma and post nasal drip syndrome)  and perpetuate the cylce of cough.  While not intuitively obvious, many patients with chronic low grade reflux do not cough until there is a primary insult that disturbs the protective epithelial barrier and exposes sensitive nerve endings.   This is typically viral but can due to PNDS and the former more likely than the latter here .     The point is that once this occurs, it is difficult to eliminate the cycle  using anything but a maximally effective acid suppression regimen at least in the short run, accompanied by an appropriate diet to address non acid GERD and control / eliminate pnds with 1st gen H1 blockers per guidelines  Then if needed eliminate   the cough itself for at least 3 days with tyl #3 while treating  any inflammatory component with short course of oral pred - see avs for instructions unique to this ov      Total time devoted to counseling  > 50 % of initial 60 min office visit:  review case with pt/ discussion of options/alternatives/ personally creating written customized instructions  in presence of pt  then going over those specific  Instructions directly with the pt including how to use all of the meds but in particular covering each new medication in detail and the difference between the maintenance= "automatic" meds and the prns using an action plan format for the latter (If this problem/symptom => do that organization reading Left to right).  Please see AVS from this visit for a full list of these instructions which I personally wrote for this pt and  are unique to this visit.

## 2018-05-26 ENCOUNTER — Telehealth: Payer: Self-pay | Admitting: *Deleted

## 2018-05-26 ENCOUNTER — Encounter: Payer: Self-pay | Admitting: Internal Medicine

## 2018-05-26 LAB — RESPIRATORY ALLERGY PROFILE REGION II ~~LOC~~
Allergen, A. alternata, m6: <0.1 kU/L
Allergen, Cottonwood, t14: <0.1 kU/L
Allergen, Oak,t7: <0.1 kU/L
Allergen, P. notatum, m1: <0.1 kU/L
Bermuda Grass: <0.1 kU/L
Box Elder IgE: <0.1 kU/L
CLADOSPORIUM HERBARUM (M2) IGE: <0.1 kU/L
CLASS: 0
CLASS: 0
CLASS: 0
CLASS: 0
CLASS: 0
CLASS: 0
CLASS: 0
CLASS: 0
CLASS: 0
COMMON RAGWEED (SHORT) (W1) IGE: <0.1 kU/L
Class: 0
Class: 0
Class: 0
Class: 0
Class: 0
Class: 0
Class: 0
Class: 0
Class: 0
Class: 0
Class: 0
Class: 0
Class: 0
Class: 0
Class: 0
Cockroach: <0.1 kU/L
Dog Dander: <0.1 kU/L
Elm IgE: <0.1 kU/L
IgE (Immunoglobulin E), Serum: 306 kU/L — ABNORMAL HIGH (ref <=114)
Johnson Grass: <0.1 kU/L
Sheep Sorrel IgE: <0.1 kU/L

## 2018-05-26 LAB — INTERPRETATION:

## 2018-05-26 NOTE — Assessment & Plan Note (Signed)
While tempting to consider cough variant asthma triggered by use of corgard, the pattern fits much better with uacs/ cyclical cough as above and so I don't rec changing rx unless not responding to rx for uacs  However, if not better rec: In the setting of respiratory symptoms of unknown etiology,  It would be preferable to use bystolic, the most beta -1  selective Beta blocker available in sample form, with bisoprolol the most selective generic choice  on the market, at least on a trial basis, to make sure the spillover Beta 2 effects of the less specific Beta blockers are not contributing to this patient's symptoms.

## 2018-05-26 NOTE — Progress Notes (Signed)
LMTCB

## 2018-05-26 NOTE — Telephone Encounter (Signed)
Patient is calling back. CB is [phone]

## 2018-05-26 NOTE — Progress Notes (Signed)
Spoke with pt and notified of results per Dr. Wert. Pt verbalized understanding and denied any questions. 

## 2018-05-26 NOTE — Telephone Encounter (Signed)
Called and spoke to pt.  Pt is aware of below message and voiced her understanding.  Nothing further is needed.

## 2018-05-26 NOTE — Telephone Encounter (Signed)
LMTCB

## 2018-05-26 NOTE — Telephone Encounter (Signed)
-----   Message from Tanda Rockers, MD sent at 05/26/2018  5:28 AM EDT ----- Forgot to put prednisone on instructions but is called in

## 2018-06-12 DIAGNOSIS — Z23 Encounter for immunization: Secondary | ICD-10-CM | POA: Diagnosis not present

## 2018-06-29 ENCOUNTER — Encounter: Payer: Self-pay | Admitting: Internal Medicine

## 2018-06-29 ENCOUNTER — Ambulatory Visit (INDEPENDENT_AMBULATORY_CARE_PROVIDER_SITE_OTHER): Payer: Medicare Other | Admitting: Internal Medicine

## 2018-06-29 VITALS — BP 120/80 | HR 82 | Ht 61.0 in | Wt 129.8 lb

## 2018-06-29 DIAGNOSIS — R058 Other specified cough: Secondary | ICD-10-CM

## 2018-06-29 DIAGNOSIS — R05 Cough: Secondary | ICD-10-CM

## 2018-06-29 NOTE — Patient Instructions (Signed)
Stop chlorpheniramine now   In week if doing just as well stop the pantoprazole and change pepcid to after bfast and after supper for a week   A week later, stop the am dose of pepcid and a week later stop the pm dose    Stay on the diet as closely as you can   In the event of a flare in the future >  Try prilosec otc 20mg   Take 30-60 min before first meal of the day and Pepcid ac (famotidine) 20 mg one @  bedtime until cough is completely gone for at least a week without the need for cough suppression - delsym  Is the best

## 2018-06-29 NOTE — Progress Notes (Signed)
Subjective:     Patient ID: Maxie Barb, female   DOB: Jan 08, 1946,     MRN: [id number]    Brief patient profile: 72 yo [place] never smoker  moved to [facility] age 30 with Rheumatic fever age 73 so not very active but no resp problems at all even with colds moved to [facility] in 2010 and fine  until march 2019 with flu-like illness abrupt onset st, ha, runny eyes / hurt all over/ some chills but no rigor or fever and all resolved  Except the cough so referred to pulmonary clinic 05/25/2018 by Dr  Deland Pretty p multiple ov's s response    History of Present Illness  05/25/2018 1st [facility] office visit/ Pasty Manninen   Chief Complaint  Patient presents with  . Pulmonary Consult    Referred by Dr. Deland Pretty. Pt c/o cough for the past 2-3 months. She states she coughs until she is gagging and coughs up clear sputum.  She states her cough is worse at night and occ wakes her up.    onset was acute with flu like illness late March 2019 all symptoms resolved x for cough  Tessalon pearls no better, rob dm no better / so far  no pred/ no inhalers or gerd rx yet as far as she can remember    Kouffman Reflux v Neurogenic Cough Differentiator Reflux Comments  Do you awaken from a sound sleep coughing violently?                            With trouble breathing? Yes all night     Do you have choking episodes when you cannot  Get enough air, gasping for air ?              Yes   Do you usually cough when you lie down into  The bed, or when you just lie down to rest ?                          Yes   Do you usually cough after meals or eating?         no   Do you cough when (or after) you bend over?    no   GERD SCORE     Kouffman Reflux v Neurogenic Cough Differentiator Neurogenic   Do you more-or-less cough all day long? Sporadic    Does change of temperature make you cough? Yes hot to cold is the worst    Does laughing or chuckling cause you to cough? yes   Do fumes (perfume, automobile fumes, burned  Toast,  etc.,) cause you to cough ?      yes   Does speaking, singing, or talking on the phone cause you to cough   ?               yes   Neurogenic/Airway score      rec Pantoprazole (protonix) 40 mg   Take  30-60 min before first meal of the day and Pepcid (famotidine)  20 mg one @  bedtime until return to office - this is the best way to tell whether stomach acid is contributing to your problem.   GERD diet  For drainage / throat tickle try take CHLORPHENIRAMINE  4 mg - take one every 4 hours as needed - available over the counter- may cause drowsiness so start with just a bedtime  dose or two and see how you tolerate it before trying in daytime   THEN IF STILL  coughing ok to use Tylenol #3 up to 2 every 4 hours if needed  Add:  Prednisone 10 mg take  4 each am x 2 days,   2 each am x 2 days,  1 each am x 2 days and stop      06/29/2018  f/u ov/Christifer Chapdelaine re:  Uacs/ resolved  Chief Complaint  Patient presents with  . Follow-up    Breathing is back to baseline and her cough has resolved. She c/o dry mouth.   Dyspnea:  Not limited by breathing from desired activities   Cough: completely better, no need for cough meds on h1 hs but mouth dry  SABA  None / on corgard s wheeze    No obvious day to day or daytime variability or assoc excess/ purulent sputum or mucus plugs or hemoptysis or cp or chest tightness, subjective wheeze or overt sinus or hb symptoms.   Sleeping: fine now flat/ one pillow  without nocturnal  or early am exacerbation  of respiratory  c/o's or need for noct saba. Also denies any obvious fluctuation of symptoms with weather or environmental changes or other aggravating or alleviating factors except as outlined above   No unusual exposure hx or h/o childhood pna/ asthma or knowledge of premature birth.  Current Allergies, Complete Past Medical History, Past Surgical History, Family History, and Social History were reviewed in [employer] record.  ROS  The  following are not active complaints unless bolded Hoarseness, sore throat, dysphagia, dental problems, itching, sneezing,  nasal congestion or discharge of excess mucus or purulent secretions, ear ache,   fever, chills, sweats, unintended wt loss or wt gain, classically pleuritic or exertional cp,  orthopnea pnd or arm/hand swelling  or leg swelling, presyncope, palpitations, abdominal pain, anorexia, nausea, vomiting, diarrhea  or change in bowel habits or change in bladder habits, change in stools or change in urine, dysuria, hematuria,  rash, arthralgias, visual complaints, headache, numbness, weakness or ataxia or problems with walking or coordination,  change in mood or  memory.        Current Meds  Medication Sig  . acetaminophen (TYLENOL) 325 MG tablet Take 650 mg by mouth every 6 (six) hours as needed.  Marland Kitchen aspirin EC 81 MG tablet Take 81 mg by mouth daily.  . famotidine (PEPCID) 20 MG tablet One at bedtime  . FIBER COMPLETE TABS Take 2 tablets by mouth daily.  . nadolol (CORGARD) 40 MG tablet Take 40 mg by mouth daily.  . nortriptyline (PAMELOR) 25 MG capsule Take 25 mg by mouth at bedtime.  . pantoprazole (PROTONIX) 40 MG tablet Take 1 tablet (40 mg total) by mouth daily. Take 30-60 min before first meal of the day                 Objective:   Physical Exam  amb pr female nad all smiles     06/29/2018        129  05/25/18 128 lb 12.8 oz (58.4 kg)  05/02/18 128 lb 12.8 oz (58.4 kg)  02/01/18 130 lb (59 kg)     Vital signs reviewed - Note on arrival 02 sats  96% on RA       No jvd Oropharynx clear Neck supple Lungs clear bilaterally to A and P  RRR no s3 or or sign murmur Abd soft/ non tender/ ok excursion  on isp Ext wam with no edema or clubbing noted Neuro  Alert/ No motor deficits               Assessment:

## 2018-06-30 ENCOUNTER — Encounter: Payer: Self-pay | Admitting: Internal Medicine

## 2018-06-30 NOTE — Assessment & Plan Note (Addendum)
Onset abrupt p viral uri March  2019 -  Allergy profile 05/25/2018 >  Eos 0.2 /  IgE  306 RAST neg   Resolved , rec taper off meds as per avs - contingency issues reviewed if cough recurs  Pulmonary f/u prn

## 2018-07-19 ENCOUNTER — Other Ambulatory Visit: Payer: Self-pay | Admitting: Internal Medicine

## 2018-08-10 DIAGNOSIS — G43919 Migraine, unspecified, intractable, without status migrainosus: Secondary | ICD-10-CM | POA: Diagnosis not present

## 2018-08-10 DIAGNOSIS — G4700 Insomnia, unspecified: Secondary | ICD-10-CM | POA: Diagnosis not present

## 2018-08-10 DIAGNOSIS — D696 Thrombocytopenia, unspecified: Secondary | ICD-10-CM | POA: Diagnosis not present

## 2018-08-10 DIAGNOSIS — M810 Age-related osteoporosis without current pathological fracture: Secondary | ICD-10-CM | POA: Diagnosis not present

## 2018-08-10 DIAGNOSIS — Z8679 Personal history of other diseases of the circulatory system: Secondary | ICD-10-CM | POA: Diagnosis not present

## 2018-08-10 DIAGNOSIS — Z8673 Personal history of transient ischemic attack (TIA), and cerebral infarction without residual deficits: Secondary | ICD-10-CM | POA: Diagnosis not present

## 2018-08-10 DIAGNOSIS — R05 Cough: Secondary | ICD-10-CM | POA: Diagnosis not present

## 2018-08-10 DIAGNOSIS — R748 Abnormal levels of other serum enzymes: Secondary | ICD-10-CM | POA: Diagnosis not present

## 2018-09-01 ENCOUNTER — Other Ambulatory Visit: Payer: Self-pay | Admitting: Internal Medicine

## 2018-09-07 ENCOUNTER — Other Ambulatory Visit: Payer: Self-pay | Admitting: Internal Medicine

## 2018-09-22 DIAGNOSIS — Z23 Encounter for immunization: Secondary | ICD-10-CM | POA: Diagnosis not present

## 2018-10-27 ENCOUNTER — Other Ambulatory Visit: Payer: Self-pay | Admitting: Internal Medicine

## 2018-10-27 DIAGNOSIS — Z1231 Encounter for screening mammogram for malignant neoplasm of breast: Secondary | ICD-10-CM

## 2018-11-02 ENCOUNTER — Encounter: Payer: Self-pay | Admitting: *Deleted

## 2018-11-02 ENCOUNTER — Ambulatory Visit (INDEPENDENT_AMBULATORY_CARE_PROVIDER_SITE_OTHER): Payer: Medicare Other | Admitting: Neurology

## 2018-11-02 ENCOUNTER — Encounter

## 2018-11-02 ENCOUNTER — Telehealth: Payer: Self-pay | Admitting: *Deleted

## 2018-11-02 ENCOUNTER — Telehealth: Payer: Self-pay | Admitting: Neurology

## 2018-11-02 ENCOUNTER — Encounter: Payer: Self-pay | Admitting: Neurology

## 2018-11-02 VITALS — BP 126/78 | HR 76 | Ht 61.0 in | Wt 131.0 lb

## 2018-11-02 DIAGNOSIS — G459 Transient cerebral ischemic attack, unspecified: Secondary | ICD-10-CM

## 2018-11-02 DIAGNOSIS — G43009 Migraine without aura, not intractable, without status migrainosus: Secondary | ICD-10-CM

## 2018-11-02 MED ORDER — BUTALBITAL-APAP-CAFFEINE 50-325-40 MG PO TABS: 1.0000 | ORAL_TABLET | Freq: Two times a day (BID) | ORAL | 5 refills | Status: DC | PRN

## 2018-11-02 MED ORDER — NORTRIPTYLINE HCL 25 MG PO CAPS: 50.0000 mg | ORAL_CAPSULE | Freq: Every day | ORAL | 11 refills | Status: DC

## 2018-11-02 MED ORDER — VENLAFAXINE HCL ER 37.5 MG PO CP24: 37.5000 mg | ORAL_CAPSULE | Freq: Every day | ORAL | 11 refills | Status: DC

## 2018-11-02 NOTE — Telephone Encounter (Signed)
4 wk Botox inj.

## 2018-11-02 NOTE — Progress Notes (Signed)
PATIENT: Terri Turner DOB: Apr 05, 1946  Chief Complaint  Patient presents with  . Headache    Transfer of care from Dr. Erlinda Hong. Reports worsening of headaches.  She has pain/pressure every day, along with dizziness and ringing in right ear.  She is taking Tylenol 500mg  BID.  She has is still taking nortriptyline 25mg  at bedtime.  . Transient Ischemic Attack    She is not taking aspirin right now due to stomach issues.     HISTORICAL  Terri Turner is a 72 years old female, previously patient of Dr. Erlinda Hong, follow-up for TIA, chronic headaches.  He has past medical history of hypertension, TIA,  She was admitted to the hospital in June 2017 for 2 episode of left-sided weakness, difficulty speaking, lasting for 15 to 30 minutes, I personally reviewed MRI showed no acute stroke, but there was evidence of small vessel disease, more on the left subcortical region, MRA of the brain, ultrasound of carotid artery showed no large vessel disease, ejection fraction 60 to 65%, LDL 111, A1c was 5.5, she is on Lipitor, supposed to take aspirin, but due to GI side effect, no longer taking aspirin.  She reported worsening headaches since TIA spells,  She reported history of migraine headaches for over 20 years, her typical migraine are lateralized sometimes holoacranial pressure sensation, with associated light noise sensitivity, nauseous, neck tension,  She was previously treated with daily Fioricet, was able to taper it off successfully,  She complains of worsening headache since August 2019, has been taking Tylenol at least 2 tablets once or twice each day,  She is on beta-blocker nadolol 40 mg daily, and nortriptyline 25 mg every night still complains of insomnia, agitation,   REVIEW OF SYSTEMS: Full 14 system review of systems performed and notable only for hearing loss, ringing the ears, light sensitivity, nauseous, neck pain, stiffness, dizziness, headaches, numbness, speech difficulty,  weakness, agitations All other review of systems were negative.  ALLERGIES: No Known Allergies  HOME MEDICATIONS: Current Outpatient Medications  Medication Sig Dispense Refill  . acetaminophen (TYLENOL) 500 MG tablet Take 500 mg by mouth 2 (two) times daily.    . nadolol (CORGARD) 40 MG tablet Take 40 mg by mouth daily.    . nortriptyline (PAMELOR) 25 MG capsule Take 25 mg by mouth at bedtime.     No current facility-administered medications for this visit.     PAST MEDICAL HISTORY: Past Medical History:  Diagnosis Date  . Headache   . Heart murmur    not treated..  . Hypertension   . TIA (transient ischemic attack)    2017    PAST SURGICAL HISTORY: Past Surgical History:  Procedure Laterality Date  . BREAST CYST ASPIRATION Right   . CHOLECYSTECTOMY    . HYSTEROSCOPY W/D&C N/A 12/29/2015   Procedure: DILATATION AND CURETTAGE /HYSTEROSCOPY;  Surgeon: Janyth Pupa, DO;  Location: [facility];  Service: Gynecology;  Laterality: N/A;  . URINARY SURGERY     mesh   . WRIST SURGERY     left thumb (pin)    FAMILY HISTORY: Family History  Problem Relation Age of Onset  . Hypertension Mother   . Stroke Father   . Prostate cancer Brother   . Hypertension Sister   . High blood pressure Sister   . Alzheimer's disease Sister     SOCIAL HISTORY: Social History   Socioeconomic History  . Marital status: Married    Spouse name: Not on file  . Number of  children: 2  . Years of education: some college  . Highest education level: High school graduate  Occupational History  . Occupation: Retired  Scientific laboratory technician  . Financial resource strain: Not on file  . Food insecurity:    Worry: Not on file    Inability: Not on file  . Transportation needs:    Medical: Not on file    Non-medical: Not on file  Tobacco Use  . Smoking status: Never Smoker  . Smokeless tobacco: Never Used  Substance and Sexual Activity  . Alcohol use: No  . Drug use: No  . Sexual activity: Not Currently   Lifestyle  . Physical activity:    Days per week: Not on file    Minutes per session: Not on file  . Stress: Not on file  Relationships  . Social connections:    Talks on phone: Not on file    Gets together: Not on file    Attends religious service: Not on file    Active member of club or organization: Not on file    Attends meetings of clubs or organizations: Not on file    Relationship status: Not on file  . Intimate partner violence:    Fear of current or ex partner: Not on file    Emotionally abused: Not on file    Physically abused: Not on file    Forced sexual activity: Not on file  Other Topics Concern  . Not on file  Social History Narrative   Lives at home with husband.   Right-handed.   No caffeine use.     PHYSICAL EXAM   Vitals:   11/02/18 1116  BP: 126/78  Pulse: 76  Weight: 131 lb (59.4 kg)  Height: 5\' 1"  (1.549 m)    Not recorded      Body mass index is 24.75 kg/m.  PHYSICAL EXAMNIATION:  Gen: NAD, conversant, well nourised, obese, well groomed                     Cardiovascular: Regular rate rhythm, no peripheral edema, warm, nontender. Eyes: Conjunctivae clear without exudates or hemorrhage Neck: Supple, no carotid bruits. Pulmonary: Clear to auscultation bilaterally   NEUROLOGICAL EXAM:  MENTAL STATUS: Speech:    Speech is normal; fluent and spontaneous with normal comprehension.  Cognition:     Orientation to time, place and person     Normal recent and remote memory     Normal Attention span and concentration     Normal Language, naming, repeating,spontaneous speech     Fund of knowledge   CRANIAL NERVES: CN II: Visual fields are full to confrontation. Fundoscopic exam is normal with sharp discs and no vascular changes. Pupils are round equal and briskly reactive to light. CN III, IV, VI: extraocular movement are normal. No ptosis. CN V: Facial sensation is intact to pinprick in all 3 divisions bilaterally. Corneal responses are  intact.  CN VII: Face is symmetric with normal eye closure and smile. CN VIII: Hearing is normal to rubbing fingers CN IX, X: Palate elevates symmetrically. Phonation is normal. CN XI: Head turning and shoulder shrug are intact CN XII: Tongue is midline with normal movements and no atrophy.  MOTOR: There is no pronator drift of out-stretched arms. Muscle bulk and tone are normal. Muscle strength is normal.  REFLEXES: Reflexes are 2+ and symmetric at the biceps, triceps, knees, and ankles. Plantar responses are flexor.  SENSORY: Intact to light touch, pinprick, positional sensation and  vibratory sensation are intact in fingers and toes.  COORDINATION: Rapid alternating movements and fine finger movements are intact. There is no dysmetria on finger-to-nose and heel-knee-shin.    GAIT/STANCE: Posture is normal. Gait is steady with normal steps, base, arm swing, and turning. Heel and toe walking are normal. Tandem gait is normal.  Romberg is absent.   DIAGNOSTIC DATA (LABS, IMAGING, TESTING) - I reviewed patient records, labs, notes, testing and imaging myself where available.   ASSESSMENT AND PLAN  Terri Turner is a 72 y.o. female   Chronic migraine headaches  Increase nortriptyline to 25 mg 2 tablets every night as preventive medication, hope this will help her chronic insomnia as well  Add on Effexor XR 37.5 mg every morning  Not a good candidate for triptan due to her age, and evidence of extensive small vessel disease on MRI of the brain  Fioricet as needed, limit the use to twice each week  Botox injection as migraine prevention  Cerebral small vessel disease  Continue to address vascular risk factors,  Aspirin 81 mg daily    Marcial Pacas, M.D. Ph.D.  [facility] [address], [place], [place] [address] Ph: [phone] Fax: [phone]  CC: Referring Provider

## 2018-11-02 NOTE — Telephone Encounter (Signed)
PA for nortriptyline approved, by [employer], through 11/02/2019.  Case [id number].  Case completed via covermymeds.

## 2018-11-06 DIAGNOSIS — R748 Abnormal levels of other serum enzymes: Secondary | ICD-10-CM | POA: Diagnosis not present

## 2018-11-06 DIAGNOSIS — I10 Essential (primary) hypertension: Secondary | ICD-10-CM | POA: Diagnosis not present

## 2018-11-06 DIAGNOSIS — N390 Urinary tract infection, site not specified: Secondary | ICD-10-CM | POA: Diagnosis not present

## 2018-11-06 DIAGNOSIS — E559 Vitamin D deficiency, unspecified: Secondary | ICD-10-CM | POA: Diagnosis not present

## 2018-11-06 DIAGNOSIS — I129 Hypertensive chronic kidney disease with stage 1 through stage 4 chronic kidney disease, or unspecified chronic kidney disease: Secondary | ICD-10-CM | POA: Diagnosis not present

## 2018-11-07 DIAGNOSIS — K7460 Unspecified cirrhosis of liver: Secondary | ICD-10-CM | POA: Diagnosis not present

## 2018-11-07 DIAGNOSIS — Z79899 Other long term (current) drug therapy: Secondary | ICD-10-CM | POA: Diagnosis not present

## 2018-11-07 DIAGNOSIS — D696 Thrombocytopenia, unspecified: Secondary | ICD-10-CM | POA: Diagnosis not present

## 2018-11-07 DIAGNOSIS — R945 Abnormal results of liver function studies: Secondary | ICD-10-CM | POA: Diagnosis not present

## 2018-11-07 DIAGNOSIS — R748 Abnormal levels of other serum enzymes: Secondary | ICD-10-CM | POA: Diagnosis not present

## 2018-11-09 DIAGNOSIS — I8500 Esophageal varices without bleeding: Secondary | ICD-10-CM | POA: Diagnosis not present

## 2018-11-09 DIAGNOSIS — M8580 Other specified disorders of bone density and structure, unspecified site: Secondary | ICD-10-CM | POA: Diagnosis not present

## 2018-11-09 DIAGNOSIS — G44219 Episodic tension-type headache, not intractable: Secondary | ICD-10-CM | POA: Diagnosis not present

## 2018-11-09 DIAGNOSIS — M169 Osteoarthritis of hip, unspecified: Secondary | ICD-10-CM | POA: Diagnosis not present

## 2018-11-09 DIAGNOSIS — D696 Thrombocytopenia, unspecified: Secondary | ICD-10-CM | POA: Diagnosis not present

## 2018-11-09 DIAGNOSIS — H9313 Tinnitus, bilateral: Secondary | ICD-10-CM | POA: Diagnosis not present

## 2018-11-09 DIAGNOSIS — K7460 Unspecified cirrhosis of liver: Secondary | ICD-10-CM | POA: Diagnosis not present

## 2018-11-09 DIAGNOSIS — J309 Allergic rhinitis, unspecified: Secondary | ICD-10-CM | POA: Diagnosis not present

## 2018-11-09 DIAGNOSIS — I10 Essential (primary) hypertension: Secondary | ICD-10-CM | POA: Diagnosis not present

## 2018-11-09 DIAGNOSIS — Z0000 Encounter for general adult medical examination without abnormal findings: Secondary | ICD-10-CM | POA: Diagnosis not present

## 2018-11-09 DIAGNOSIS — G43009 Migraine without aura, not intractable, without status migrainosus: Secondary | ICD-10-CM | POA: Diagnosis not present

## 2018-11-09 DIAGNOSIS — I8510 Secondary esophageal varices without bleeding: Secondary | ICD-10-CM | POA: Diagnosis not present

## 2018-11-09 NOTE — Telephone Encounter (Signed)
I called to schedule the patient but she did not answer so I left a VM asking her to call.

## 2018-11-24 ENCOUNTER — Other Ambulatory Visit: Payer: Self-pay | Admitting: Neurology

## 2018-11-30 ENCOUNTER — Telehealth: Payer: Self-pay | Admitting: Neurology

## 2018-11-30 DIAGNOSIS — G459 Transient cerebral ischemic attack, unspecified: Secondary | ICD-10-CM | POA: Diagnosis not present

## 2018-11-30 DIAGNOSIS — G43009 Migraine without aura, not intractable, without status migrainosus: Secondary | ICD-10-CM | POA: Diagnosis not present

## 2018-11-30 DIAGNOSIS — M8580 Other specified disorders of bone density and structure, unspecified site: Secondary | ICD-10-CM | POA: Diagnosis not present

## 2018-11-30 DIAGNOSIS — Z8673 Personal history of transient ischemic attack (TIA), and cerebral infarction without residual deficits: Secondary | ICD-10-CM | POA: Diagnosis not present

## 2018-11-30 NOTE — Telephone Encounter (Signed)
I have discussed with patient's clinical pharmacist, she still complains of headache 4-5 times each month, is scheduled for BOTOX on Jan 11 2019.  Will consider CGRP if she fails BOTOX injections

## 2018-12-08 ENCOUNTER — Ambulatory Visit
Admission: RE | Admit: 2018-12-08 | Discharge: 2018-12-08 | Disposition: A | Discharge status: Home / Self Care | Payer: Medicare Other | Source: Ambulatory Visit | Attending: Internal Medicine | Admitting: Internal Medicine

## 2018-12-08 DIAGNOSIS — Z1231 Encounter for screening mammogram for malignant neoplasm of breast: Secondary | ICD-10-CM

## 2018-12-14 DIAGNOSIS — Z23 Encounter for immunization: Secondary | ICD-10-CM | POA: Diagnosis not present

## 2019-01-08 ENCOUNTER — Encounter: Payer: Self-pay | Admitting: Internal Medicine

## 2019-01-08 ENCOUNTER — Ambulatory Visit (INDEPENDENT_AMBULATORY_CARE_PROVIDER_SITE_OTHER)
Admission: RE | Admit: 2019-01-08 | Discharge: 2019-01-08 | Disposition: A | Discharge status: Home / Self Care | Payer: Medicare HMO | Source: Ambulatory Visit | Attending: Internal Medicine | Admitting: Internal Medicine

## 2019-01-08 ENCOUNTER — Telehealth: Payer: Self-pay | Admitting: Neurology

## 2019-01-08 ENCOUNTER — Ambulatory Visit: Payer: Medicare HMO | Admitting: Internal Medicine

## 2019-01-08 VITALS — BP 118/82 | HR 78 | Ht 61.0 in | Wt 129.6 lb

## 2019-01-08 DIAGNOSIS — R05 Cough: Secondary | ICD-10-CM

## 2019-01-08 DIAGNOSIS — R058 Other specified cough: Secondary | ICD-10-CM

## 2019-01-08 MED ORDER — OXYCODONE HCL 5 MG PO CAPS: 5.0000 mg | ORAL_CAPSULE | ORAL | 0 refills | Status: DC | PRN

## 2019-01-08 MED ORDER — PREDNISONE 10 MG PO TABS: ORAL_TABLET | ORAL | 0 refills | Status: DC

## 2019-01-08 MED ORDER — FAMOTIDINE 20 MG PO TABS: ORAL_TABLET | ORAL | 11 refills | Status: DC

## 2019-01-08 MED ORDER — PANTOPRAZOLE SODIUM 40 MG PO TBEC: 40.0000 mg | DELAYED_RELEASE_TABLET | Freq: Every day | ORAL | 2 refills | Status: DC

## 2019-01-08 NOTE — Telephone Encounter (Signed)
Pt returned call. Please call back when available. 

## 2019-01-08 NOTE — Patient Instructions (Addendum)
Pantoprazole (protonix) 40 mg  Take  30-60 min before first meal of the day and Pepcid (famotidine)  20 mg one @  bedtime until return to office - this is the best way to tell whether stomach acid is contributing to your problem.     GERD (REFLUX)  is an extremely common cause of respiratory symptoms just like yours , many times with no obvious heartburn at all.    It can be treated with medication, but also with lifestyle changes including elevation of the head of your bed (ideally with 6 inch  bed blocks),  Smoking cessation, avoidance of late meals, excessive alcohol, and avoid fatty foods, chocolate, peppermint, colas, red wine, and acidic juices such as orange juice.  NO MINT OR MENTHOL PRODUCTS SO NO COUGH DROPS   USE SUGARLESS CANDY INSTEAD (Jolley ranchers or Stover's or Life Savers) or even ice chips will also do - the key is to swallow to prevent all throat clearing. NO OIL BASED VITAMINS - use powdered substitutes.  For drainage / throat tickle try take CHLORPHENIRAMINE  4 mg - take one every 4 hours as needed - available over the counter- may cause drowsiness so start with just a bedtime dose or two and see how you tolerate it before trying in daytime     THEN IF STILL  coughing ok to use oxyir  up to 1 every 4 hours if needed     Add:  Prednisone 10 mg take  4 each am x 2 days,   2 each am x 2 days,  1 each am x 2 days and stop    Please remember to go to the  x-ray department  for your tests - we will call you with the results when they are available        Please schedule a follow up office visit in 4 weeks, sooner if needed

## 2019-01-08 NOTE — Telephone Encounter (Signed)
I returned the call, the patient did not answer. I left another VM. When patient calls back please ask her how many migraine days she has a month if I am not available.

## 2019-01-08 NOTE — Telephone Encounter (Signed)
I spoke with the patient who says that the new medicine Dr. Krista Blue put her on has worked very well. She has not had a single migraine since. She wants to hold off on Botox.

## 2019-01-08 NOTE — Progress Notes (Signed)
Subjective:    Patient ID: Terri Turner, female   DOB: [birth year],     MRN: [id number]    Brief patient profile: 74 yo [place] never smoker  moved to [facility] age 68 with Rheumatic fever age 37 so not very active but no resp problems at all even with colds moved to [facility] in 2010 and fine  until march 2019 with flu-like illness abrupt onset st, ha, runny eyes / hurt all over/ some chills but no rigor or fever and all resolved  Except the cough so referred to pulmonary clinic 05/25/2018 by Dr  Terri Turner p multiple ov's s response    History of Present Illness  05/25/2018 1st [facility] office visit/ Terri Turner   Chief Complaint  Patient presents with  . Pulmonary Consult    Referred by Dr. Deland Turner. Pt c/o cough for the past 2-3 months. She states she coughs until she is gagging and coughs up clear sputum.  She states her cough is worse at night and occ wakes her up.    onset was acute with flu like illness late March 2019 all symptoms resolved x for cough  Tessalon pearls no better, rob dm no better / so far  no pred/ no inhalers or gerd rx yet as far as she can remember    Kouffman Reflux v Neurogenic Cough Differentiator Reflux Comments  Do you awaken from a sound sleep coughing violently?                            With trouble breathing? Yes all night     Do you have choking episodes when you cannot  Get enough air, gasping for air ?              Yes   Do you usually cough when you lie down into  The bed, or when you just lie down to rest ?                          Yes   Do you usually cough after meals or eating?         no   Do you cough when (or after) you bend over?    no   GERD SCORE     Kouffman Reflux v Neurogenic Cough Differentiator Neurogenic   Do you more-or-less cough all day long? Sporadic    Does change of temperature make you cough? Yes hot to cold is the worst    Does laughing or chuckling cause you to cough? yes   Do fumes (perfume, automobile fumes, burned  Toast,  etc.,) cause you to cough ?      yes   Does speaking, singing, or talking on the phone cause you to cough   ?               yes   Neurogenic/Airway score      rec Pantoprazole (protonix) 40 mg   Take  30-60 min before first meal of the day and Pepcid (famotidine)  20 mg one @  bedtime until return to office - this is the best way to tell whether stomach acid is contributing to your problem.   GERD diet  For drainage / throat tickle try take CHLORPHENIRAMINE  4 mg - take one every 4 hours as needed - available over the counter- may cause drowsiness so start with just a bedtime dose  or two and see how you tolerate it before trying in daytime   THEN IF STILL  coughing ok to use Tylenol #3 up to 2 every 4 hours if needed  Add:  Prednisone 10 mg take  4 each am x 2 days,   2 each am x 2 days,  1 each am x 2 days and stop      06/29/2018  f/u ov/Terri Turner re:  Uacs/ resolved  Chief Complaint  Patient presents with  . Follow-up    Breathing is back to baseline and her cough has resolved. She c/o dry mouth.   Dyspnea:  Not limited by breathing from desired activities   Cough: completely better, no need for cough meds on h1 hs but mouth dry SABA  None / on corgard s wheeze  rec Stop chlorpheniramine now  In week if doing just as well stop the pantoprazole and change pepcid to after bfast and after supper for a week  A week later, stop the am dose of pepcid and a week later stop the pm dose  Stay on the diet as closely as you can In the event of a flare in the future >  Try prilosec otc 20mg   Take 30-60 min before first meal of the day and Pepcid ac (famotidine) 20 mg one @  bedtime until cough is completely gone for at least a week without the need for cough suppression - delsym  Is the best      01/08/2019 acute extended ov/Terri Turner re: recurrent cough to point of gag/vomit  Chief Complaint  Patient presents with  . Acute Visit    cough with clear mucus, irritated throat and tickle, SOB with  coughing  no problem coming off gerd rx 06/2018 then recurrent cough during acute  flu like illness late November 2019 and did not follow contingency rx at all  Worse after 15 min supine then tickle cough then hb  And loses voice in that order Only sob if coughing No better with otc cough meds / not on anything for gerd . What she does cough up is min clear mucus  No obvious other patterns in  day to day or daytime variability or assoc excess/ purulent sputum or mucus plugs or hemoptysis or cp or chest tightness, subjective wheeze or overt sinus or hb symptoms.     Also denies any obvious fluctuation of symptoms with weather or environmental changes or other aggravating or alleviating factors except as outlined above   No unusual exposure hx or h/o childhood pna/ asthma or knowledge of premature birth.  Current Allergies, Complete Past Medical History, Past Surgical History, Family History, and Social History were reviewed in [employer] record.  ROS  The following are not active complaints unless bolded Hoarseness, sore throat, dysphagia, dental problems, itching, sneezing,  nasal congestion or discharge of excess mucus or purulent secretions, ear ache,   fever, chills, sweats, unintended wt loss or wt gain, classically pleuritic or exertional cp,  orthopnea pnd or arm/hand swelling  or leg swelling, presyncope, palpitations, abdominal pain, anorexia, nausea, vomiting, diarrhea  or change in bowel habits or change in bladder habits, change in stools or change in urine, dysuria, hematuria,  rash, arthralgias, visual complaints, headache, numbness, weakness or ataxia or problems with walking or coordination,  change in mood or  memory.        Current Meds  Medication Sig  . butalbital-acetaminophen-caffeine (FIORICET, ESGIC) 50-325-40 MG tablet Take 1 tablet by mouth  every 12 (twelve) hours as needed for headache. No refill in less than 30 days  . nadolol (CORGARD) 40 MG  tablet Take 40 mg by mouth daily.  . nortriptyline (PAMELOR) 25 MG capsule Take 2 capsules (50 mg total) by mouth at bedtime.  Marland Kitchen venlafaxine XR (EFFEXOR-XR) 37.5 MG 24 hr capsule TAKE 1 CAPSULE (37.5 MG TOTAL) BY MOUTH DAILY WITH BREAKFAST.  Marland Kitchen                       Objective:   Physical Exam  amb pf female nad    01/08/2019        129  06/29/2018        129  05/25/18 128 lb 12.8 oz (58.4 kg)  05/02/18 128 lb 12.8 oz (58.4 kg)  02/01/18 130 lb (59 kg)      Vital signs reviewed - Note on arrival 02 sats  98% on RA     HEENT: nl dentition, turbinates bilaterally, and oropharynx. Nl external ear canals without cough reflex   NECK :  without JVD/Nodes/TM/ nl carotid upstrokes bilaterally   LUNGS: no acc muscle use,  Nl contour chest which is clear to A and P bilaterally without cough on insp or exp maneuvers   CV:  RRR  no s3 or murmur or increase in P2, and no edema   ABD:  soft and nontender with nl inspiratory excursion in the supine position. No bruits or organomegaly appreciated, bowel sounds nl  MS:  Nl gait/ ext warm without deformities, calf tenderness, cyanosis or clubbing No obvious joint restrictions   SKIN: warm and dry without lesions    NEURO:  alert, approp, nl sensorium with  no motor or cerebellar deficits apparent.          CXR PA and Lateral:   01/08/2019 :    I personally reviewed images and agree with radiology impression as follows:    No active cardiopulmonary disease.      Assessment:

## 2019-01-08 NOTE — Telephone Encounter (Signed)
I called the patient to inquire about how many migraine days she is having a month since the notes do not indicate. She did not answer so I left a VM asking her to call me back. DW

## 2019-01-08 NOTE — Telephone Encounter (Signed)
I called [employer] to start PA for Botox. Fax [phone]. They are sending me a PA form.

## 2019-01-09 ENCOUNTER — Encounter: Payer: Self-pay | Admitting: Internal Medicine

## 2019-01-09 NOTE — Assessment & Plan Note (Signed)
Onset abrupt p viral uri March  2019 > resolved on gerd rx -  Allergy profile 05/25/2018 >  Eos 0.2 /  IgE  306 RAST neg  - recurred Nov 2019  ? Again after viral uri > eval 01/08/2019 and rec repeat cyclical cough rx   Upper airway cough syndrome (previously labeled PNDS),  is so named because it's frequently impossible to sort out how much is  CR/sinusitis with freq throat clearing (which can be related to primary GERD)   vs  causing  secondary (" extra esophageal")  GERD from wide swings in gastric pressure that occur with throat clearing, often  promoting self use of mint and menthol lozenges that reduce the lower esophageal sphincter tone and exacerbate the problem further in a cyclical fashion.   These are the same pts (now being labeled as having "irritable larynx syndrome" by some cough centers) who not infrequently have a history of having failed to tolerate ace inhibitors,  dry powder inhalers or biphosphonates or report having atypical/extraesophageal reflux symptoms that don't respond to standard doses of PPI  and are easily confused as having aecopd or asthma flares by even experienced allergists/ pulmonologists (myself included).   Of the three most common causes of  Sub-acute / recurrent or chronic cough, only one (GERD)  can actually contribute to/ trigger  the other two (asthma and post nasal drip syndrome)  and perpetuate the cylce of cough.  While not intuitively obvious, many patients with chronic low grade reflux do not cough until there is a primary insult that disturbs the protective epithelial barrier and exposes sensitive nerve endings.   This is typically viral but can due to PNDS and  either may apply here.   The point is that once this occurs, it is difficult to eliminate the cycle  using anything but a maximally effective acid suppression regimen at least in the short run, accompanied by an appropriate diet to address non acid GERD and control / eliminate the cough itself for at  least 3 days with oxyir if necessary to interrupt the cycle.    I had an extended discussion with the patient reviewing all relevant studies completed to date and  lasting 25 minutes of a 40  minute acute office visit addressing  non-specific but potentially very serious refractory respiratory symptoms of uncertain and potentially multiple  etiologies.   Each maintenance medication was reviewed in detail including most importantly the difference between maintenance and prns and under what circumstances the prns are to be triggered using an action plan format that is not reflected in the computer generated alphabetically organized AVS.    Please see AVS for specific instructions unique to this office visit that I personally wrote and verbalized to the the pt in detail and then reviewed with pt  by my nurse highlighting any changes in therapy/plan of care  recommended at today's visit.

## 2019-01-11 ENCOUNTER — Ambulatory Visit: Payer: Medicare Other | Admitting: Neurology

## 2019-02-06 ENCOUNTER — Ambulatory Visit: Payer: Medicare HMO | Admitting: Internal Medicine

## 2019-03-05 DIAGNOSIS — G43809 Other migraine, not intractable, without status migrainosus: Secondary | ICD-10-CM | POA: Diagnosis not present

## 2019-03-05 DIAGNOSIS — K219 Gastro-esophageal reflux disease without esophagitis: Secondary | ICD-10-CM | POA: Diagnosis not present

## 2019-03-05 DIAGNOSIS — E663 Overweight: Secondary | ICD-10-CM | POA: Diagnosis not present

## 2019-03-05 DIAGNOSIS — R69 Illness, unspecified: Secondary | ICD-10-CM | POA: Diagnosis not present

## 2019-03-05 DIAGNOSIS — I10 Essential (primary) hypertension: Secondary | ICD-10-CM | POA: Diagnosis not present

## 2019-03-05 DIAGNOSIS — K7460 Unspecified cirrhosis of liver: Secondary | ICD-10-CM | POA: Diagnosis not present

## 2019-03-05 DIAGNOSIS — G4700 Insomnia, unspecified: Secondary | ICD-10-CM | POA: Diagnosis not present

## 2019-03-05 DIAGNOSIS — M1611 Unilateral primary osteoarthritis, right hip: Secondary | ICD-10-CM | POA: Diagnosis not present

## 2019-03-05 DIAGNOSIS — H9193 Unspecified hearing loss, bilateral: Secondary | ICD-10-CM | POA: Diagnosis not present

## 2019-03-05 DIAGNOSIS — H9313 Tinnitus, bilateral: Secondary | ICD-10-CM | POA: Diagnosis not present

## 2019-03-06 DIAGNOSIS — R682 Dry mouth, unspecified: Secondary | ICD-10-CM | POA: Diagnosis not present

## 2019-03-06 DIAGNOSIS — R1110 Vomiting, unspecified: Secondary | ICD-10-CM | POA: Diagnosis not present

## 2019-03-06 DIAGNOSIS — R05 Cough: Secondary | ICD-10-CM | POA: Diagnosis not present

## 2019-03-06 DIAGNOSIS — K219 Gastro-esophageal reflux disease without esophagitis: Secondary | ICD-10-CM | POA: Diagnosis not present

## 2019-03-16 ENCOUNTER — Ambulatory Visit: Payer: Medicare HMO | Admitting: Internal Medicine

## 2019-04-05 DIAGNOSIS — R05 Cough: Secondary | ICD-10-CM | POA: Diagnosis not present

## 2019-04-05 DIAGNOSIS — K219 Gastro-esophageal reflux disease without esophagitis: Secondary | ICD-10-CM | POA: Diagnosis not present

## 2019-04-05 DIAGNOSIS — K7460 Unspecified cirrhosis of liver: Secondary | ICD-10-CM | POA: Diagnosis not present

## 2019-05-17 ENCOUNTER — Encounter: Payer: Self-pay | Admitting: *Deleted

## 2019-05-17 ENCOUNTER — Telehealth: Payer: Self-pay | Admitting: Neurology

## 2019-05-17 NOTE — Telephone Encounter (Signed)
The patient feels her symptoms were well controlled on nortriptyline 50mg  at bedtime.  States her GI physician instructed her to decrease the dose to 25mg  to see if it would help her GERD.  Says her famotidine was increased and omeprazole was added. She feels her headaches have worsened with the decrease in medication. She was actually just calling to inform us of this information.  She is going to be speaking with her GI physician about going back to nortriptyline 50mg  at bedtime and discussing other options for her GERD.

## 2019-05-17 NOTE — Telephone Encounter (Signed)
Left message requesting a return call.

## 2019-05-17 NOTE — Telephone Encounter (Signed)
Pt has called to report she has been having headaches,dizzyness, ringing in right ear.  Pt states the medication the Dr Krista Blue had been working fine for her until her GI doctor made a change.  Pt questions if this is the reason for how she is feeling.  Pt was offered a virtual visit with Dr Krista Blue she declined.  Pt would like a call back

## 2019-06-26 DIAGNOSIS — I10 Essential (primary) hypertension: Secondary | ICD-10-CM | POA: Diagnosis not present

## 2019-06-26 DIAGNOSIS — H109 Unspecified conjunctivitis: Secondary | ICD-10-CM | POA: Diagnosis not present

## 2019-10-18 DIAGNOSIS — Z0000 Encounter for general adult medical examination without abnormal findings: Secondary | ICD-10-CM | POA: Diagnosis not present

## 2019-10-18 DIAGNOSIS — Z23 Encounter for immunization: Secondary | ICD-10-CM | POA: Diagnosis not present

## 2019-10-18 DIAGNOSIS — K219 Gastro-esophageal reflux disease without esophagitis: Secondary | ICD-10-CM | POA: Diagnosis not present

## 2019-10-18 DIAGNOSIS — K7460 Unspecified cirrhosis of liver: Secondary | ICD-10-CM | POA: Diagnosis not present

## 2019-10-18 DIAGNOSIS — I10 Essential (primary) hypertension: Secondary | ICD-10-CM | POA: Diagnosis not present

## 2019-10-24 DIAGNOSIS — L810 Postinflammatory hyperpigmentation: Secondary | ICD-10-CM | POA: Diagnosis not present

## 2019-10-24 DIAGNOSIS — L308 Other specified dermatitis: Secondary | ICD-10-CM | POA: Diagnosis not present

## 2019-11-01 ENCOUNTER — Other Ambulatory Visit: Payer: Self-pay | Admitting: Family Medicine

## 2019-11-01 DIAGNOSIS — Z1231 Encounter for screening mammogram for malignant neoplasm of breast: Secondary | ICD-10-CM

## 2019-11-09 DIAGNOSIS — H524 Presbyopia: Secondary | ICD-10-CM | POA: Diagnosis not present

## 2019-11-09 DIAGNOSIS — H52223 Regular astigmatism, bilateral: Secondary | ICD-10-CM | POA: Diagnosis not present

## 2019-11-09 DIAGNOSIS — H5213 Myopia, bilateral: Secondary | ICD-10-CM | POA: Diagnosis not present

## 2019-11-14 ENCOUNTER — Other Ambulatory Visit: Payer: Self-pay

## 2019-11-14 DIAGNOSIS — Z20822 Contact with and (suspected) exposure to covid-19: Secondary | ICD-10-CM

## 2019-11-16 LAB — NOVEL CORONAVIRUS, NAA: SARS-CoV-2, NAA: NOT DETECTED

## 2019-11-23 ENCOUNTER — Telehealth: Payer: Self-pay | Admitting: Family Medicine

## 2019-11-23 NOTE — Telephone Encounter (Signed)
Patient called in and received her negative covid test result  

## 2019-12-28 ENCOUNTER — Ambulatory Visit: Payer: Medicare HMO

## 2020-01-06 ENCOUNTER — Other Ambulatory Visit: Payer: Self-pay | Admitting: Internal Medicine

## 2020-02-05 ENCOUNTER — Ambulatory Visit
Admission: RE | Admit: 2020-02-05 | Discharge: 2020-02-05 | Disposition: A | Discharge status: Home / Self Care | Payer: Medicare HMO | Source: Ambulatory Visit | Attending: Family Medicine | Admitting: Family Medicine

## 2020-02-05 ENCOUNTER — Other Ambulatory Visit: Payer: Self-pay

## 2020-02-05 DIAGNOSIS — Z1231 Encounter for screening mammogram for malignant neoplasm of breast: Secondary | ICD-10-CM

## 2020-10-01 ENCOUNTER — Telehealth: Payer: Self-pay | Admitting: Neurology

## 2020-10-01 ENCOUNTER — Other Ambulatory Visit: Payer: Self-pay | Admitting: Neurology

## 2020-10-01 MED ORDER — NORTRIPTYLINE HCL 25 MG PO CAPS: 50.0000 mg | ORAL_CAPSULE | Freq: Every day | ORAL | 1 refills | Status: DC

## 2020-10-01 NOTE — Telephone Encounter (Signed)
Pt called stating she is needing a refill on her nortriptyline (PAMELOR) 25 MG capsule sent in to the [facility] on [address]. Pt has scheduled a medication management f/u with NP.

## 2020-10-01 NOTE — Telephone Encounter (Signed)
I spoke to the patient. She did not come for her follow up in 2020 because of COVID-19 concerns (due to her age and her husband's cancer dx).  She had stopped nortriptyline for quite some time while she was getting the causes of GERD investigated. She restarted the medication several months ago. She has scheduled a follow up on 11/20/2019 and would like a refill to last until this appt. Refill has been sent to the pharmacy.

## 2020-10-25 ENCOUNTER — Other Ambulatory Visit: Payer: Self-pay | Admitting: Neurology

## 2020-11-19 ENCOUNTER — Telehealth: Payer: Self-pay | Admitting: Neurology

## 2020-11-19 ENCOUNTER — Encounter: Payer: Self-pay | Admitting: Neurology

## 2020-11-19 ENCOUNTER — Ambulatory Visit: Payer: Medicare HMO | Admitting: Neurology

## 2020-11-19 VITALS — BP 124/75 | HR 64 | Ht 61.0 in | Wt 143.8 lb

## 2020-11-19 DIAGNOSIS — G459 Transient cerebral ischemic attack, unspecified: Secondary | ICD-10-CM | POA: Diagnosis not present

## 2020-11-19 DIAGNOSIS — G43709 Chronic migraine without aura, not intractable, without status migrainosus: Secondary | ICD-10-CM | POA: Diagnosis not present

## 2020-11-19 MED ORDER — UBRELVY 50 MG PO TABS: 50.0000 mg | ORAL_TABLET | ORAL | 11 refills | Status: AC | PRN

## 2020-11-19 MED ORDER — NORTRIPTYLINE HCL 25 MG PO CAPS: 50.0000 mg | ORAL_CAPSULE | Freq: Every day | ORAL | 3 refills | Status: DC

## 2020-11-19 NOTE — Patient Instructions (Addendum)
Continue the nortriptyline at current dosing Try Ubrelvy for acute headache, 1 tablet at onset of headache See you back in 6 months  Ubrogepant tablets What is this medicine? UBROGEPANT (ue BROE je pant) is used to treat migraine headaches with or without aura. An aura is a strange feeling or visual disturbance that warns you of an attack. It is not used to prevent migraines. This medicine may be used for other purposes; ask your health care provider or pharmacist if you have questions. COMMON BRAND NAME(S): [name] What should I tell my health care provider before I take this medicine? They need to know if you have any of these conditions:  kidney disease  liver disease  an unusual or allergic reaction to ubrogepant, other medicines, foods, dyes, or preservatives  pregnant or trying to get pregnant  breast-feeding How should I use this medicine? Take this medicine by mouth with a glass of water. Follow the directions on the prescription label. You can take it with or without food. If it upsets your stomach, take it with food. Take your medicine at regular intervals. Do not take it more often than directed. Do not stop taking except on your doctor's advice. Talk to your pediatrician about the use of this medicine in children. Special care may be needed. Overdosage: If you think you have taken too much of this medicine contact a poison control center or emergency room at once. NOTE: This medicine is only for you. Do not share this medicine with others. What if I miss a dose? This does not apply. This medicine is not for regular use. What may interact with this medicine? Do not take this medicine with any of the following medicines:  ceritinib  certain antibiotics like chloramphenicol, clarithromycin, telithromycin  certain antivirals for HIV like atazanavir, cobicistat, darunavir, delavirdine, fosamprenavir, indinavir, ritonavir  certain medicines for fungal infections like  itraconazole, ketoconazole, posaconazole, voriconazole  conivaptan  grapefruit  idelalisib  mifepristone  nefazodone  ribociclib This medicine may also interact with the following medications:  carvedilol  certain medicines for seizures like phenobarbital, phenytoin  ciprofloxacin  cyclosporine  eltrombopag  fluconazole  fluvoxamine  quinidine  rifampin  St. John's wort  verapamil This list may not describe all possible interactions. Give your health care provider a list of all the medicines, herbs, non-prescription drugs, or dietary supplements you use. Also tell them if you smoke, drink alcohol, or use illegal drugs. Some items may interact with your medicine. What should I watch for while using this medicine? Visit your health care professional for regular checks on your progress. Tell your health care professional if your symptoms do not start to get better or if they get worse. Your mouth may get dry. Chewing sugarless gum or sucking hard candy and drinking plenty of water may help. Contact your health care professional if the problem does not go away or is severe. What side effects may I notice from receiving this medicine? Side effects that you should report to your doctor or health care professional as soon as possible:  allergic reactions like skin rash, itching or hives; swelling of the face, lips, or tongue Side effects that usually do not require medical attention (report these to your doctor or health care professional if they continue or are bothersome):  drowsiness  dry mouth  nausea  tiredness This list may not describe all possible side effects. Call your doctor for medical advice about side effects. You may report side effects to FDA at 1-800-FDA-1088.  Where should I keep my medicine? Keep out of the reach of children. Store at room temperature between 15 and 30 degrees C (59 and 86 degrees F). Throw away any unused medicine after the  expiration date. NOTE: This sheet is a summary. It may not cover all possible information. If you have questions about this medicine, talk to your doctor, pharmacist, or health care provider.  2020 Elsevier/Gold Standard (2019-02-22 08:50:55)

## 2020-11-19 NOTE — Telephone Encounter (Signed)
Received a PA request from patient's pharmacy for Ubrelvy 50mg . PA was started on [email or web address]. Key is [id number]. PA was approved until 12/19/2020. Determination has been faxed to patient's pharmacy.

## 2020-11-19 NOTE — Progress Notes (Signed)
PATIENT: Terri Turner DOB: Mar 24, 1946  REASON FOR VISIT: follow up HISTORY FROM: patient  HISTORY OF PRESENT ILLNESS: Today 11/19/20  HISTORY RILLIE RIFFEL is a 74 years old female, previously patient of Dr. Erlinda Hong, follow-up for TIA, chronic headaches.  He has past medical history of hypertension, TIA,  She was admitted to the hospital in June 2017 for 2 episode of left-sided weakness, difficulty speaking, lasting for 15 to 30 minutes, I personally reviewed MRI showed no acute stroke, but there was evidence of small vessel disease, more on the left subcortical region, MRA of the brain, ultrasound of carotid artery showed no large vessel disease, ejection fraction 60 to 65%, LDL 111, A1c was 5.5, she is on Lipitor, supposed to take aspirin, but due to GI side effect, no longer taking aspirin.  She reported worsening headaches since TIA spells,  She reported history of migraine headaches for over 20 years, her typical migraine are lateralized sometimes holoacranial pressure sensation, with associated light noise sensitivity, nauseous, neck tension,  She was previously treated with daily Fioricet, was able to taper it off successfully,  She complains of worsening headache since August 2019, has been taking Tylenol at least 2 tablets once or twice each day,  She is on beta-blocker nadolol 40 mg daily, and nortriptyline 25 mg every night still complains of insomnia, agitation,  Update November 19, 2020 SS: Not seen since since November 2019. Migraines frequency depends on stress, more stress last 3 month, headaches came in September, her husband is sick, is doing better now. Have to lay down in bedroom, make it dark. Are right sided, pressure behind both eyes, typical migraine pattern, with ringing in right ear. Has cirrhosis of liver now (in 2019) is stable, seeing GI. For headache, not taking anything, can't tale Tylenol anymore. Reports migraines, 4-5 a week. Stopped Effexor-XR,  unknown what reason? Nortriptyline is helping, she knows. Doesn't want Botox. Needs to see PCP.  REVIEW OF SYSTEMS: Out of a complete 14 system review of symptoms, the patient complains only of the following symptoms, and all other reviewed systems are negative.  Headache   ALLERGIES: No Known Allergies  HOME MEDICATIONS: Outpatient Medications Prior to Visit  Medication Sig Dispense Refill  . famotidine (PEPCID) 40 MG tablet Take 40 mg by mouth 2 (two) times daily.    . nadolol (CORGARD) 40 MG tablet Take 40 mg by mouth daily.    Marland Kitchen omeprazole (PRILOSEC) 40 MG capsule Take by mouth daily.    . pantoprazole (PROTONIX) 40 MG tablet Take 1 tablet (40 mg total) by mouth daily. Take 30-60 min before first meal of the day 30 tablet 2  . nortriptyline (PAMELOR) 25 MG capsule TAKE 2 CAPSULES (50 MG TOTAL) BY MOUTH AT BEDTIME. 180 capsule 0  . butalbital-acetaminophen-caffeine (FIORICET, ESGIC) 50-325-40 MG tablet Take 1 tablet by mouth every 12 (twelve) hours as needed for headache. No refill in less than 30 days 10 tablet 5  . famotidine (PEPCID) 20 MG tablet One at bedtime 30 tablet 11  . oxycodone (OXY-IR) 5 MG capsule Take 1 capsule (5 mg total) by mouth every 4 (four) hours as needed. 30 capsule 0  . predniSONE (DELTASONE) 10 MG tablet Take  4 each am x 2 days,   2 each am x 2 days,  1 each am x 2 days and stop 14 tablet 0  . venlafaxine XR (EFFEXOR-XR) 37.5 MG 24 hr capsule TAKE 1 CAPSULE (37.5 MG TOTAL) BY MOUTH DAILY  WITH BREAKFAST. 90 capsule 4   No facility-administered medications prior to visit.    PAST MEDICAL HISTORY: Past Medical History:  Diagnosis Date  . Headache   . Heart murmur    not treated..  . Hypertension   . TIA (transient ischemic attack)    2017    PAST SURGICAL HISTORY: Past Surgical History:  Procedure Laterality Date  . BREAST CYST ASPIRATION Right   . CHOLECYSTECTOMY    . HYSTEROSCOPY WITH D & C N/A 12/29/2015   Procedure: DILATATION AND CURETTAGE  /HYSTEROSCOPY;  Surgeon: Janyth Pupa, DO;  Location: [facility];  Service: Gynecology;  Laterality: N/A;  . URINARY SURGERY     mesh   . WRIST SURGERY     left thumb (pin)    FAMILY HISTORY: Family History  Problem Relation Age of Onset  . Hypertension Mother   . Stroke Father   . Prostate cancer Brother   . Hypertension Sister   . High blood pressure Sister   . Alzheimer's disease Sister     SOCIAL HISTORY: Social History   Socioeconomic History  . Marital status: Married    Spouse name: Not on file  . Number of children: 2  . Years of education: some college  . Highest education level: High school graduate  Occupational History  . Occupation: Retired  Tobacco Use  . Smoking status: Never Smoker  . Smokeless tobacco: Never Used  Substance and Sexual Activity  . Alcohol use: No  . Drug use: No  . Sexual activity: Not Currently  Other Topics Concern  . Not on file  Social History Narrative   Lives at home with husband.   Right-handed.   No caffeine use.   Social Determinants of Health   Financial Resource Strain:   . Difficulty of Paying Living Expenses: Not on file  Food Insecurity:   . Worried About Charity fundraiser in the Last Year: Not on file  . Ran Out of Food in the Last Year: Not on file  Transportation Needs:   . Lack of Transportation (Medical): Not on file  . Lack of Transportation (Non-Medical): Not on file  Physical Activity:   . Days of Exercise per Week: Not on file  . Minutes of Exercise per Session: Not on file  Stress:   . Feeling of Stress : Not on file  Social Connections:   . Frequency of Communication with Friends and Family: Not on file  . Frequency of Social Gatherings with Friends and Family: Not on file  . Attends Religious Services: Not on file  . Active Member of Clubs or Organizations: Not on file  . Attends Archivist Meetings: Not on file  . Marital Status: Not on file  Intimate Partner Violence:   . Fear of  Current or Ex-Partner: Not on file  . Emotionally Abused: Not on file  . Physically Abused: Not on file  . Sexually Abused: Not on file   PHYSICAL EXAM  Vitals:   11/19/20 0824  BP: 124/75  Pulse: 64  Weight: 143 lb 12.8 oz (65.2 kg)  Height: 5\' 1"  (1.549 m)   Body mass index is 27.17 kg/m.  Generalized: Well developed, in no acute distress   Neurological examination  Mentation: Alert oriented to time, place, history taking. Follows all commands speech and language fluent Cranial nerve II-XII: Pupils were equal round reactive to light. Extraocular movements were full, visual field were full on confrontational test. Facial sensation and strength were normal.  Head turning and shoulder shrug  were normal and symmetric. Motor: The motor testing reveals 5 over 5 strength of all 4 extremities. Good symmetric motor tone is noted throughout.  Sensory: Sensory testing is intact to soft touch on all 4 extremities. No evidence of extinction is noted.  Coordination: Cerebellar testing reveals good finger-nose-finger and heel-to-shin bilaterally.  Gait and station: Gait is normal.  Reflexes: Deep tendon reflexes are symmetric and normal bilaterally.   DIAGNOSTIC DATA (LABS, IMAGING, TESTING) - I reviewed patient records, labs, notes, testing and imaging myself where available.  Lab Results  Component Value Date   WBC 6.6 05/25/2018   HGB 13.6 05/25/2018   HCT 40.4 05/25/2018   MCV 84.9 05/25/2018   PLT 120.0 (L) 05/25/2018      Component Value Date/Time   NA 141 02/01/2018 1113   K 4.0 02/01/2018 1113   CL 110 02/01/2018 1113   CO2 21 (L) 02/01/2018 1113   GLUCOSE 92 02/01/2018 1113   BUN 16 02/01/2018 1113   CREATININE 0.72 02/01/2018 1113   CALCIUM 9.6 02/01/2018 1113   PROT 7.7 02/01/2018 1113   ALBUMIN 4.1 02/01/2018 1113   AST 40 02/01/2018 1113   ALT 33 02/01/2018 1113   ALKPHOS 207 (H) 02/01/2018 1113   BILITOT 1.0 02/01/2018 1113   GFRNONAA >60 02/01/2018 1113    GFRAA >60 02/01/2018 1113   Lab Results  Component Value Date   CHOL 191 06/04/2016   HDL 63 06/04/2016   LDLCALC 111 (H) 06/04/2016   TRIG 85 06/04/2016   CHOLHDL 3.0 06/04/2016   Lab Results  Component Value Date   HGBA1C 5.5 06/04/2016   No results found for: VITAMINB12 No results found for: TSH    ASSESSMENT AND PLAN 74 y.o. year old female  has a past medical history of Headache, Heart murmur, Hypertension, and TIA (transient ischemic attack). here with:  1.  Chronic migraine headache -Continue nortriptyline 50 mg at bedtime -Try Ubrelvy 50 mg as needed for acute headache, no more Fioricet since cirrhosis reported, not a candidate for triptan due to age and vascular risk factors -Doesn't want to add on another agent, not interested in Botox, headaches are stress related, stress improving, already taking BB -Follow-up in 6 months or sooner if needed  From Up to Date: Ubrelvy Dosing: Hepatic Impairment: Adult Mild to moderate impairment (Child-Pugh class A, B): No dosage adjustment necessary. Severe impairment (Child-Pugh class C): 50 mg as a single dose; if symptoms persist or return, may repeat dose after ?2 hours. Maximum dose: 100 mg per 24 hours.  2.  Cerebral small vessel disease -Continue aspirin 81 mg daily -Continue to address vascular risk factors with PCP, needs to schedule follow-up  I spent 20 minutes of face-to-face and non-face-to-face time with patient.  This included previsit chart review, lab review, study review, order entry, electronic health record documentation, patient education.  Butler Denmark, AGNP-C, DNP 11/19/2020, 9:21 AM [facility] [address], [place], [place] [address] [phone]

## 2021-01-06 ENCOUNTER — Other Ambulatory Visit: Payer: Self-pay | Admitting: Family Medicine

## 2021-01-06 DIAGNOSIS — Z1231 Encounter for screening mammogram for malignant neoplasm of breast: Secondary | ICD-10-CM

## 2021-01-22 NOTE — Progress Notes (Signed)
I have reviewed and agreed above plan. 

## 2021-02-17 ENCOUNTER — Ambulatory Visit: Payer: Medicare HMO

## 2021-03-09 ENCOUNTER — Inpatient Hospital Stay: Admission: RE | Admit: 2021-03-09 | Payer: Medicare HMO | Source: Ambulatory Visit

## 2021-03-18 ENCOUNTER — Other Ambulatory Visit: Payer: Self-pay | Admitting: Gastroenterology

## 2021-03-24 ENCOUNTER — Ambulatory Visit: Payer: Medicare HMO | Admitting: Neurology

## 2021-03-24 ENCOUNTER — Encounter: Payer: Self-pay | Admitting: Neurology

## 2021-03-24 VITALS — BP 126/79 | HR 71 | Ht 61.0 in | Wt 147.0 lb

## 2021-03-24 DIAGNOSIS — G43709 Chronic migraine without aura, not intractable, without status migrainosus: Secondary | ICD-10-CM | POA: Diagnosis not present

## 2021-03-24 DIAGNOSIS — M542 Cervicalgia: Secondary | ICD-10-CM | POA: Insufficient documentation

## 2021-03-24 NOTE — Progress Notes (Signed)
Chief Complaint  Patient presents with  . Follow-up    She is taking nortriptyline 25mg , two tablets at bedtime. Recently, she has noticed an increase in headache frequency. Last week, she had one every day. However, she has been under added stress due to being in the middle of a move to a new apartment. She has tried [place] once and felt it worked well.      ASSESSMENT AND PLAN  Terri Turner is a 75 y.o. female   Chronic migraine headaches  Worsened in fall 2021 was excessive stress, much improved after restarting nortriptyline 25 mg 2 tablets every night as preventive medication, she can sleep much better  [name] works well, may repeat once in 2 hours if needed,  Has history of GERD, not a good candidate for NSAIDs, EGD pending  Cervicogenic pain  She has significant tenderness upon deep palpitation at right nuchal line,  I have suggested warm compression, neck stretching, massage therapy   DIAGNOSTIC DATA (LABS, IMAGING, TESTING) - I reviewed patient records, labs, notes, testing and imaging myself where available.  MRI of brain in June 2017, moderate small vessel disease, no acute stroke, MRA of brain was normal  HISTORICAL  Terri Turner is a 75 year old female, returns to clinic follow-up for migraine headaches, her primary care physician is Dr.   Lindell Noe, Anastasia Pall, MD  I reviewed and summarized the referring note.  Past medical history Hypertension GERD Possible TIAs in the past, moderate small vessel disease on MRI of the brain in June 2017  She was admitted to the hospital in June 2017 for 2 episode of left-sided weakness, difficulty speaking, lasting for 15 to 30 minutes,  MRI showed no acute stroke,but there was evidence of moderate small vessel disease, more on the left subcortical region,MRA of the brain, ultrasound of carotid artery showed no large vessel disease, ejection fraction 60 to 65%, LDL 111, A1c was 5.5, she is on Lipitor, supposed to  take aspirin, but due to GI side effect, no longer taking aspirin.  She reported worsening headaches since TIA spells in 2017  She reported history of migraine headaches for over 20 years, her typical migraine are lateralized sometimes holoacranial pressure sensation, with associated light noise sensitivity, nauseous, neck tension,  She was previously treated with daily Fioricet, was able to taper it off successfully,  She complains of worsening headache since August 2019, has been taking Tylenol at least 2 tablets once or twice each day for a while, Tylenol was able to taper down successfully  She is on beta-blockernadolol40 mg daily, she was added on nortriptyline 25 mg every night, later titrating to 50 mg every night which has helped her headache,  She was doing very well from 20 19-20 21, return to clinic again in December 2021 complains of increased headache frequency, this is concurrent with her increased stress of taking care of her elderly husband, who is also my patient,  She was put back on nortriptyline 50 mg every night, overall has improved, not a good candidate for triptan, tried [name] once works very well,  She returned complains of frequent headaches since March 2022, sometimes can linger around for 1 week, starting from right occipital area, often has tenderness of the right nuchal line, spreading forward, become right parietal retro-orbital area moderate pounding headache with light noise sensitivity, movement made it worse, sleeping dark was helpful  REVIEW OF SYSTEMS: Full 14 system review of systems performed and notable only for as above  All other review of systems were negative.  PHYSICAL EXAM   Vitals:   03/24/21 1026  BP: 126/79  Pulse: 71  Weight: 147 lb (66.7 kg)  Height: 5\' 1"  (1.549 m)   Not recorded     Body mass index is 27.78 kg/m.  PHYSICAL EXAMNIATION:  Gen: NAD, conversant, well nourised, well groomed                      Cardiovascular: Regular rate rhythm, no peripheral edema, warm, nontender. Eyes: Conjunctivae clear without exudates or hemorrhage Neck: Supple, no carotid bruits.  Tenderness along right nuchal line with deep palpitation Pulmonary: Clear to auscultation bilaterally   NEUROLOGICAL EXAM:  MENTAL STATUS: Speech:    Speech is normal; fluent and spontaneous with normal comprehension.  Cognition:     Orientation to time, place and person     Normal recent and remote memory     Normal Attention span and concentration     Normal Language, naming, repeating,spontaneous speech     Fund of knowledge   CRANIAL NERVES: CN II: Visual fields are full to confrontation. Pupils are round equal and briskly reactive to light. CN III, IV, VI: extraocular movement are normal. No ptosis. CN V: Facial sensation is intact to light touch CN VII: Face is symmetric with normal eye closure  CN VIII: Hearing is normal to causal conversation. CN IX, X: Phonation is normal. CN XI: Head turning and shoulder shrug are intact  MOTOR: There is no pronator drift of out-stretched arms. Muscle bulk and tone are normal. Muscle strength is normal.  REFLEXES: Reflexes are 2+ and symmetric at the biceps, triceps, knees, and ankles. Plantar responses are flexor.  SENSORY: Intact to light touch, pinprick and vibratory sensation are intact in fingers and toes.  COORDINATION: There is no trunk or limb dysmetria noted.  GAIT/STANCE: Posture is normal. Gait is steady with normal steps, base, arm swing, and turning. Heel and toe walking are normal. Tandem gait is normal.  Romberg is absent.  ALLERGIES: No Known Allergies  HOME MEDICATIONS: Current Outpatient Medications  Medication Sig Dispense Refill  . famotidine (PEPCID) 40 MG tablet Take 40 mg by mouth 2 (two) times daily.    . nadolol (CORGARD) 40 MG tablet Take 40 mg by mouth daily.    . nortriptyline (PAMELOR) 25 MG capsule Take 2 capsules (50 mg total) by  mouth at bedtime. 180 capsule 3  . omeprazole (PRILOSEC) 40 MG capsule Take by mouth daily.    . pantoprazole (PROTONIX) 40 MG tablet Take 1 tablet (40 mg total) by mouth daily. Take 30-60 min before first meal of the day 30 tablet 2  . Ubrogepant (UBRELVY) 50 MG TABS Take 50 mg by mouth as needed (take 1 tablet at onset of headache, may repeat 2 hours later if needed). 12 tablet 11   No current facility-administered medications for this visit.    PAST MEDICAL HISTORY: Past Medical History:  Diagnosis Date  . Headache   . Heart murmur    not treated..  . Hypertension   . TIA (transient ischemic attack)    2017    PAST SURGICAL HISTORY: Past Surgical History:  Procedure Laterality Date  . BREAST CYST ASPIRATION Right   . CHOLECYSTECTOMY    . HYSTEROSCOPY WITH D & C N/A 12/29/2015   Procedure: DILATATION AND CURETTAGE /HYSTEROSCOPY;  Surgeon: Janyth Pupa, DO;  Location: [facility];  Service: Gynecology;  Laterality: N/A;  . URINARY SURGERY  mesh   . WRIST SURGERY     left thumb (pin)    FAMILY HISTORY: Family History  Problem Relation Age of Onset  . Hypertension Mother   . Stroke Father   . Prostate cancer Brother   . Hypertension Sister   . High blood pressure Sister   . Alzheimer's disease Sister     SOCIAL HISTORY: Social History   Socioeconomic History  . Marital status: Married    Spouse name: Not on file  . Number of children: 2  . Years of education: some college  . Highest education level: High school graduate  Occupational History  . Occupation: Retired  Tobacco Use  . Smoking status: Never Smoker  . Smokeless tobacco: Never Used  Substance and Sexual Activity  . Alcohol use: No  . Drug use: No  . Sexual activity: Not Currently  Other Topics Concern  . Not on file  Social History Narrative   Lives at home with husband.   Right-handed.   No caffeine use.   Social Determinants of Health   Financial Resource Strain: Not on file  Food  Insecurity: Not on file  Transportation Needs: Not on file  Physical Activity: Not on file  Stress: Not on file  Social Connections: Not on file  Intimate Partner Violence: Not on file      Marcial Pacas, M.D. Ph.D.  [facility] [address], [place], [place] [address] Ph: [phone] Fax: [phone]  CC:  Glenis Smoker, MD [place],  [place] [address]  Glenis Smoker, MD

## 2021-03-24 NOTE — Patient Instructions (Addendum)
Ubrelvy 50mg  as needed, may repeat once in 2 hours.  Do not use more than 2 times in one week.  Magnesium oxide 400mg  +riboflavin =B2  Twice a day

## 2021-04-13 ENCOUNTER — Other Ambulatory Visit: Payer: Self-pay

## 2021-04-13 ENCOUNTER — Encounter (HOSPITAL_COMMUNITY): Payer: Self-pay | Admitting: Gastroenterology

## 2021-04-14 ENCOUNTER — Other Ambulatory Visit (HOSPITAL_COMMUNITY)
Admission: RE | Admit: 2021-04-14 | Discharge: 2021-04-14 | Disposition: A | Discharge status: Home / Self Care | Payer: Medicare HMO | Source: Ambulatory Visit | Attending: Gastroenterology | Admitting: Gastroenterology

## 2021-04-14 DIAGNOSIS — Z01812 Encounter for preprocedural laboratory examination: Secondary | ICD-10-CM | POA: Diagnosis present

## 2021-04-14 DIAGNOSIS — Z20822 Contact with and (suspected) exposure to covid-19: Secondary | ICD-10-CM | POA: Insufficient documentation

## 2021-04-15 LAB — SARS CORONAVIRUS 2 (TAT 6-24 HRS): SARS Coronavirus 2: NEGATIVE

## 2021-04-17 ENCOUNTER — Ambulatory Visit (HOSPITAL_COMMUNITY): Payer: Medicare HMO | Admitting: Anesthesiology

## 2021-04-17 ENCOUNTER — Encounter (HOSPITAL_COMMUNITY)
Admission: RE | Disposition: A | Discharge status: Home / Self Care | Payer: Self-pay | Source: Home / Self Care | Attending: Gastroenterology

## 2021-04-17 ENCOUNTER — Ambulatory Visit (HOSPITAL_COMMUNITY)
Admission: RE | Admit: 2021-04-17 | Discharge: 2021-04-17 | Disposition: A | Discharge status: Home / Self Care | Payer: Medicare HMO | Attending: Gastroenterology | Admitting: Gastroenterology

## 2021-04-17 ENCOUNTER — Encounter (HOSPITAL_COMMUNITY): Payer: Self-pay | Admitting: Gastroenterology

## 2021-04-17 ENCOUNTER — Other Ambulatory Visit: Payer: Self-pay

## 2021-04-17 DIAGNOSIS — K7460 Unspecified cirrhosis of liver: Secondary | ICD-10-CM | POA: Diagnosis not present

## 2021-04-17 DIAGNOSIS — K219 Gastro-esophageal reflux disease without esophagitis: Secondary | ICD-10-CM | POA: Insufficient documentation

## 2021-04-17 DIAGNOSIS — I8510 Secondary esophageal varices without bleeding: Secondary | ICD-10-CM | POA: Diagnosis not present

## 2021-04-17 DIAGNOSIS — K766 Portal hypertension: Secondary | ICD-10-CM | POA: Insufficient documentation

## 2021-04-17 DIAGNOSIS — Z9049 Acquired absence of other specified parts of digestive tract: Secondary | ICD-10-CM | POA: Diagnosis not present

## 2021-04-17 DIAGNOSIS — K760 Fatty (change of) liver, not elsewhere classified: Secondary | ICD-10-CM | POA: Diagnosis not present

## 2021-04-17 DIAGNOSIS — K3189 Other diseases of stomach and duodenum: Secondary | ICD-10-CM | POA: Diagnosis not present

## 2021-04-17 HISTORY — PX: ESOPHAGOGASTRODUODENOSCOPY (EGD) WITH PROPOFOL: SHX5813

## 2021-04-17 SURGERY — ESOPHAGOGASTRODUODENOSCOPY (EGD) WITH PROPOFOL
Anesthesia: Monitor Anesthesia Care

## 2021-04-17 MED ORDER — PROPOFOL 500 MG/50ML IV EMUL
INTRAVENOUS | Status: DC | PRN
  Administered 2021-04-17: 200 ug/kg/min via INTRAVENOUS

## 2021-04-17 MED ORDER — SODIUM CHLORIDE 0.9 % IV SOLN: INTRAVENOUS | Status: DC

## 2021-04-17 SURGICAL SUPPLY — 14 items

## 2021-04-17 NOTE — Anesthesia Preprocedure Evaluation (Addendum)
Anesthesia Evaluation  Patient identified by MRN, date of birth, ID band Patient awake    Reviewed: Allergy & Precautions, NPO status , Patient's Chart, lab work & pertinent test results  History of Anesthesia Complications Negative for: history of anesthetic complications  Airway Mallampati: II  TM Distance: >3 FB Neck ROM: Full    Dental  (+) Teeth Intact   Pulmonary neg pulmonary ROS,    breath sounds clear to auscultation       Cardiovascular hypertension, Pt. on medications  Rhythm:Regular  Left ventricle: The cavity size was normal. Systolic function was  normal. The estimated ejection fraction was in the range of 60%  to 65%. Wall motion was normal; there were no regional wall  motion abnormalities. Left ventricular diastolic function  parameters were normal.  - Atrial septum: No defect or patent foramen ovale was identified.  - Tricuspid valve: There was moderate regurgitation.  - Pulmonary arteries: PA peak pressure: 32 mm Hg (S).   Neuro/Psych  Headaches, TIAnegative psych ROS   GI/Hepatic negative GI ROS, Neg liver ROS,   Endo/Other  negative endocrine ROS  Renal/GU negative Renal ROS     Musculoskeletal negative musculoskeletal ROS (+)   Abdominal   Peds  Hematology negative hematology ROS (+)   Anesthesia Other Findings   Reproductive/Obstetrics                             Anesthesia Physical Anesthesia Plan  ASA: II  Anesthesia Plan: MAC   Post-op Pain Management:    Induction: Intravenous  PONV Risk Score and Plan: 2 and Treatment may vary due to age or medical condition and Propofol infusion  Airway Management Planned: Nasal Cannula  Additional Equipment: None  Intra-op Plan:   Post-operative Plan:   Informed Consent: I have reviewed the patients History and Physical, chart, labs and discussed the procedure including the risks, benefits and  alternatives for the proposed anesthesia with the patient or authorized representative who has indicated his/her understanding and acceptance.     Dental advisory given  Plan Discussed with: CRNA  Anesthesia Plan Comments:        Anesthesia Quick Evaluation

## 2021-04-17 NOTE — Op Note (Addendum)
[facility] Patient Name: Terri Turner Procedure Date: 04/17/2021 MRN: [id number] Attending MD: Carol Ada , MD Date of Birth: 05/01/1946 CSN: [id number] Age: 75 Admit Type: Outpatient Procedure:                Upper GI endoscopy Indications:              Follow-up of esophageal varices Providers:                Carol Ada, MD, Kary Kos RN, RN, Dulcy Fanny, Tyrone Apple, Technician, April                            Carter, CRNA Referring MD:              Medicines:                Propofol per Anesthesia Complications:            No immediate complications. Estimated Blood Loss:     Estimated blood loss: none. Procedure:                Pre-Anesthesia Assessment:                           - Prior to the procedure, a History and Physical                            was performed, and patient medications and                            allergies were reviewed. The patient's tolerance of                            previous anesthesia was also reviewed. The risks                            and benefits of the procedure and the sedation                            options and risks were discussed with the patient.                            All questions were answered, and informed consent                            was obtained. Prior Anticoagulants: The patient has                            taken no previous anticoagulant or antiplatelet                            agents. ASA Grade Assessment: III - A patient with  severe systemic disease. After reviewing the risks                            and benefits, the patient was deemed in                            satisfactory condition to undergo the procedure.                           - Sedation was administered by an anesthesia                            professional. Deep sedation was attained.                           After obtaining informed consent, the  endoscope was                            passed under direct vision. Throughout the                            procedure, the patient's blood pressure, pulse, and                            oxygen saturations were monitored continuously. The                            GIF-H190 ([id number]) Olympus gastroscope was                            introduced through the mouth, and advanced to the                            second part of duodenum. The upper GI endoscopy was                            accomplished without difficulty. The patient                            tolerated the procedure well. Scope In: Scope Out: Findings:      Small (< 5 mm) varices were found in the middle third of the esophagus       and in the lower third of the esophagus.      Mild portal hypertensive gastropathy was found in the gastric fundus and       in the gastric body.      The examined duodenum was normal. Impression:               - Small (< 5 mm) esophageal varices.                           - Portal hypertensive gastropathy.                           - No specimens collected. Moderate Sedation:  Not Applicable - Patient had care per Anesthesia. Recommendation:           - Patient has a contact number available for                            emergencies. The signs and symptoms of potential                            delayed complications were discussed with the                            patient. Return to normal activities tomorrow.                            Written discharge instructions were provided to the                            patient.                           - Resume regular diet.                           - Continue present medications.                           - Repeat upper endoscopy in 2 years for                            surveillance. The patient has ongoing liver disease                            with an elevated AP. Procedure Code(s):        --- Professional ---                            [phone], Esophagogastroduodenoscopy, flexible,                            transoral; diagnostic, including collection of                            specimen(s) by brushing or washing, when performed                            (separate procedure) Diagnosis Code(s):        --- Professional ---                           I85.00, Esophageal varices without bleeding                           K76.6, Portal hypertension                           K31.89, Other diseases of stomach and duodenum CPT copyright 2019 American Medical Association. All rights reserved. The  codes documented in this report are preliminary and upon coder review may  be revised to meet current compliance requirements. Carol Ada, MD Carol Ada, MD 04/17/2021 1:13:29 PM This report has been signed electronically. Number of Addenda: 0

## 2021-04-17 NOTE — Anesthesia Procedure Notes (Signed)
Procedure Name: MAC Date/Time: 04/17/2021 1:09 PM Performed by: Lieutenant Diego, CRNA Pre-anesthesia Checklist: Patient identified, Emergency Drugs available, Suction available, Patient being monitored and Timeout performed Patient Re-evaluated:Patient Re-evaluated prior to induction Oxygen Delivery Method: Simple face mask Preoxygenation: Pre-oxygenation with 100% oxygen Induction Type: IV induction

## 2021-04-17 NOTE — Transfer of Care (Signed)
Immediate Anesthesia Transfer of Care Note  Patient: BRIELYN BOSAK  Procedure(s) Performed: ESOPHAGOGASTRODUODENOSCOPY (EGD) WITH PROPOFOL (N/A )  Patient Location: Endoscopy Unit  Anesthesia Type:MAC  Level of Consciousness: drowsy  Airway & Oxygen Therapy: Patient Spontanous Breathing and Patient connected to face mask oxygen  Post-op Assessment: Report given to RN and Post -op Vital signs reviewed and stable  Post vital signs: Reviewed and stable  Last Vitals:  Vitals Value Taken Time  BP 124/61 04/17/21 1308  Temp    Pulse 70 04/17/21 1309  Resp 17 04/17/21 1309  SpO2 98 % 04/17/21 1309  Vitals shown include unvalidated device data.  Last Pain:  Vitals:   04/17/21 1203  TempSrc: Oral  PainSc: 0-No pain         Complications: No complications documented.

## 2021-04-17 NOTE — H&P (Signed)
  Terri Turner HPI: This 75 year old Hispanic female presents to the office for a 2 year EGD. Her last EGD was done on 01/04/201 which revealed grage 1 esophageal varices, a small hiatal hernia and a normal appearing duodenum. She has a history of fatty liver/ NASH cirrosis which was diagnosed in 2019.  She is taking Omeprazole in the morning and Famotidine in the evening for acid reflux with good control. She has occasional problems swallowing solids. She has 1 BM per day with no obvious blood or mucus in the stool. She has good appetite and her weight has been stable. She denies having any complaints of abdominal pain, nausea, vomiting or odynophagia. She denies having a family history of colon cancer, celiac sprue or IBD. She had a normal colonoscopy done on 05/23/2015.   Past Medical History:  Diagnosis Date  . Headache   . Heart murmur    not treated..  . Hypertension   . TIA (transient ischemic attack)    2017    Past Surgical History:  Procedure Laterality Date  . BREAST CYST ASPIRATION Right   . CHOLECYSTECTOMY    . HYSTEROSCOPY WITH D & C N/A 12/29/2015   Procedure: DILATATION AND CURETTAGE /HYSTEROSCOPY;  Surgeon: Janyth Pupa, DO;  Location: [facility];  Service: Gynecology;  Laterality: N/A;  . URINARY SURGERY     mesh   . WRIST SURGERY     left thumb (pin)    Family History  Problem Relation Age of Onset  . Hypertension Mother   . Stroke Father   . Prostate cancer Brother   . Hypertension Sister   . High blood pressure Sister   . Alzheimer's disease Sister     Social History:  reports that she has never smoked. She has never used smokeless tobacco. She reports that she does not drink alcohol and does not use drugs.  Allergies: No Known Allergies  Medications:  Scheduled:  Continuous: . sodium chloride      No results found for this or any previous visit (from the past 24 hour(s)).   No results found.  ROS:  As stated above in the HPI otherwise  negative.  Blood pressure (!) 157/79, pulse 62, temperature 97.7 F (36.5 C), temperature source Oral, resp. rate 16, height 5\' 1"  (1.549 m), weight 65.8 kg, SpO2 100 %.    PE: Gen: NAD, Alert and Oriented HEENT:  [place]/AT, EOMI Neck: Supple, no LAD Lungs: CTA Bilaterally CV: RRR without M/G/R ABD: Soft, NTND, +BS Ext: No C/C/E  Assessment/Plan: 1) Esophageal varices. 2) Cirrhosis.  Plan: 1) EGD.  Calton Harshfield D 04/17/2021, 12:12 PM

## 2021-04-17 NOTE — Discharge Instructions (Signed)
YOU HAD AN ENDOSCOPIC PROCEDURE TODAY: Refer to the procedure report and other information in the discharge instructions given to you for any specific questions about what was found during the examination. If this information does not answer your questions, please call [facility] at [phone] to clarify.   YOU SHOULD EXPECT: Some feelings of bloating in the abdomen. Passage of more gas than usual. Walking can help get rid of the air that was put into your GI tract during the procedure and reduce the bloating. Some abdominal soreness may be present for a day or two, also.  DIET: Your first meal following the procedure should be a light meal and then it is ok to progress to your normal diet. A half-sandwich or bowl of soup is an example of a good first meal. Heavy or fried foods are harder to digest and may make you feel nauseous or bloated. Drink plenty of fluids but you should avoid alcoholic beverages for 24 hours.   ACTIVITY: Your care partner should take you home directly after the procedure. You should plan to take it easy, moving slowly for the rest of the day. You can resume normal activity the day after the procedure however YOU SHOULD NOT DRIVE, use power tools, machinery or perform tasks that involve climbing or major physical exertion for 24 hours (because of the sedation medicines used during the test).   SYMPTOMS TO REPORT IMMEDIATELY: A gastroenterologist can be reached at any hour. Please call [phone]  for any of the following symptoms:   Following upper endoscopy (EGD, EUS, ERCP, esophageal dilation) Vomiting of blood or coffee ground material  New, significant abdominal pain  New, significant chest pain or pain under the shoulder blades  Painful or persistently difficult swallowing  New shortness of breath  Black, tarry-looking or red, bloody stools  FOLLOW UP:  If any biopsies were taken you will be contacted by phone or by letter within the next 1-3 weeks. Call  [phone]  if you have not heard about the biopsies in 3 weeks.  Please also call with any specific questions about appointments or follow up tests.  

## 2021-04-20 ENCOUNTER — Encounter (HOSPITAL_COMMUNITY): Payer: Self-pay | Admitting: Gastroenterology

## 2021-04-21 NOTE — Anesthesia Postprocedure Evaluation (Signed)
Anesthesia Post Note  Patient: Terri Turner  Procedure(s) Performed: ESOPHAGOGASTRODUODENOSCOPY (EGD) WITH PROPOFOL (N/A )     Patient location during evaluation: Endoscopy Anesthesia Type: MAC Level of consciousness: awake and alert Pain management: pain level controlled Vital Signs Assessment: post-procedure vital signs reviewed and stable Respiratory status: spontaneous breathing, nonlabored ventilation, respiratory function stable and patient connected to nasal cannula oxygen Cardiovascular status: stable and blood pressure returned to baseline Postop Assessment: no apparent nausea or vomiting Anesthetic complications: no   No complications documented.  Last Vitals:  Vitals:   04/17/21 1330 04/17/21 1340  BP: 105/68 (!) 137/100  Pulse: (!) 58 (!) 59  Resp: 18 (!) 23  Temp:    SpO2: 96% 92%    Last Pain:  Vitals:   04/20/21 1537  TempSrc:   PainSc: 0-No pain                 [name]

## 2021-04-30 ENCOUNTER — Ambulatory Visit
Admission: RE | Admit: 2021-04-30 | Discharge: 2021-04-30 | Disposition: A | Discharge status: Home / Self Care | Payer: Medicare HMO | Source: Ambulatory Visit | Attending: Family Medicine | Admitting: Family Medicine

## 2021-04-30 ENCOUNTER — Other Ambulatory Visit: Payer: Self-pay

## 2021-04-30 DIAGNOSIS — Z1231 Encounter for screening mammogram for malignant neoplasm of breast: Secondary | ICD-10-CM

## 2021-05-21 ENCOUNTER — Ambulatory Visit: Payer: Medicare HMO | Admitting: Neurology

## 2021-08-19 ENCOUNTER — Other Ambulatory Visit: Payer: Self-pay | Admitting: Gastroenterology

## 2021-08-19 DIAGNOSIS — Z8719 Personal history of other diseases of the digestive system: Secondary | ICD-10-CM

## 2021-08-26 ENCOUNTER — Ambulatory Visit
Admission: RE | Admit: 2021-08-26 | Discharge: 2021-08-26 | Disposition: A | Discharge status: Home / Self Care | Payer: Medicare HMO | Source: Ambulatory Visit | Attending: Gastroenterology | Admitting: Gastroenterology

## 2021-08-26 DIAGNOSIS — Z8719 Personal history of other diseases of the digestive system: Secondary | ICD-10-CM

## 2021-10-08 ENCOUNTER — Encounter: Payer: Self-pay | Admitting: Neurology

## 2021-10-08 ENCOUNTER — Ambulatory Visit: Payer: Medicare HMO | Admitting: Neurology

## 2021-10-08 VITALS — BP 128/75 | HR 79 | Ht 61.0 in | Wt 142.0 lb

## 2021-10-08 DIAGNOSIS — G43709 Chronic migraine without aura, not intractable, without status migrainosus: Secondary | ICD-10-CM | POA: Diagnosis not present

## 2021-10-08 DIAGNOSIS — H538 Other visual disturbances: Secondary | ICD-10-CM | POA: Diagnosis not present

## 2021-10-08 NOTE — Progress Notes (Signed)
Chief Complaint  Patient presents with   Follow-up    New room - alone. Reports having two migraines weekly that respond well to either Tylenol or Ubrelvy.       ASSESSMENT AND PLAN  Terri Turner is a 75 y.o. female   Chronic migraine headaches  Worsened in fall 2021 was excessive stress, much improved after restarting nortriptyline 25 mg 2 tablets every night as preventive medication, she can sleep much better  Terri Turner works well, may repeat once in 2 hours if needed,  Has history of GERD, not a good candidate for NSAIDs,  Episode of sudden onset blurry vision, seeing white in bilateral visual field  Can be related to her macular degeneration  Ultrasound of carotid artery to rule out significant carotid stenosis  Vascular risk factor of aging, hypertension TIA in past  DIAGNOSTIC DATA (LABS, IMAGING, TESTING) - I reviewed patient records, labs, notes, testing and imaging myself where available.  MRI of brain in June 2017, moderate small vessel disease, no acute stroke, MRA of brain was normal  HISTORICAL  Terri Turner is a 75 year old female, returns to clinic follow-up for migraine headaches, her primary care physician is Dr.   Lindell Noe, Anastasia Pall, MD  I reviewed and summarized the referring note.  Past medical history Hypertension GERD Possible TIAs in the past, moderate small vessel disease on MRI of the brain in June 2017  She was admitted to the hospital in June 2017 for 2 episode of left-sided weakness, difficulty speaking, lasting for 15 to 30 minutes,   MRI showed no acute stroke, but there was evidence of moderate small vessel disease, more on the left subcortical region, MRA of the brain, ultrasound of carotid artery showed no large vessel disease, ejection fraction 60 to 65%, LDL 111, A1c was 5.5, she is on Lipitor, supposed to take aspirin, but due to GI side effect, no longer taking aspirin.   She reported worsening headaches since TIA spells in  2017   She reported history of migraine headaches for over 20 years, her typical migraine are lateralized sometimes holoacranial pressure sensation, with associated light noise sensitivity, nauseous, neck tension,   She was previously treated with daily Fioricet, was able to taper it off successfully,   She complains of worsening headache since August 2019, has been taking Tylenol at least 2 tablets once or twice each day for a while, Tylenol was able to taper down successfully  She is on beta-blocker nadolol 40 mg daily, she was added on nortriptyline 25 mg every night, later titrating to 50 mg every night which has helped her headache,  She was doing very well from 20 19-20 21, return to clinic again in December 2021 complains of increased headache frequency, this is concurrent with her increased stress of taking care of her elderly husband, who is also my patient,  She was put back on nortriptyline 50 mg every night, overall has improved, not a good candidate for triptan, tried Terri Turner once works very well,  She returned complains of frequent headaches since March 2022, sometimes can linger around for 1 week, starting from right occipital area, often has tenderness of the right nuchal line, spreading forward, become right parietal retro-orbital area moderate pounding headache with light noise sensitivity, movement made it worse, sleeping dark was helpful  UPDATE Oct 08 2021: Headache overall is under good control, couple times each months, typical headache a lateralized moderate to severe pounding headache with light, noise sensitivity, nausea, relieved by  liking dark quiet room for couple hours, Ubrelvy as needed was helpful,  She also reported 1 episode of sudden onset bilateral blurry vision, few weeks ago seeing white in her visual field, lasting less than couple minutes, no focal signs afterwards, it happened in standing position, she does has bilateral macular degeneration  PHYSICAL  EXAM   Vitals:   10/08/21 1336  BP: 128/75  Pulse: 79  Weight: 142 lb (64.4 kg)  Height: 5\' 1"  (1.549 m)   Not recorded     Body mass index is 26.83 kg/m.  PHYSICAL EXAMNIATION:  Gen: NAD, conversant, well nourised, well groomed                     Cardiovascular: Regular rate rhythm, no peripheral edema, warm, nontender. Eyes: Conjunctivae clear without exudates or hemorrhage Neck: Supple, no carotid bruits.  Tenderness along right nuchal line with deep palpitation Pulmonary: Clear to auscultation bilaterally   NEUROLOGICAL EXAM:  MENTAL STATUS: Speech:    Speech is normal; fluent and spontaneous with normal comprehension.  Cognition:     Orientation to time, place and person     Normal recent and remote memory     Normal Attention span and concentration     Normal Language, naming, repeating,spontaneous speech     Fund of knowledge   CRANIAL NERVES: CN II: Visual fields are full to confrontation. Pupils are round equal and briskly reactive to light. CN III, IV, VI: extraocular movement are normal. No ptosis. CN V: Facial sensation is intact to light touch CN VII: Face is symmetric with normal eye closure  CN VIII: Hearing is normal to causal conversation. CN IX, X: Phonation is normal. CN XI: Head turning and shoulder shrug are intact  MOTOR: There is no pronator drift of out-stretched arms. Muscle bulk and tone are normal. Muscle strength is normal.  REFLEXES: Reflexes are 2+ and symmetric at the biceps, triceps, knees, and ankles. Plantar responses are flexor.  SENSORY: Intact to light touch, pinprick and vibratory sensation are intact in fingers and toes.  COORDINATION: There is no trunk or limb dysmetria noted.  GAIT/STANCE: Posture is normal. Gait is steady with normal steps, base, arm swing, and turning. Heel and toe walking are normal. Tandem gait is normal.  Romberg is absent. REVIEW OF SYSTEMS: Full 14 system review of systems performed and  notable only for as above All other review of systems were negative.   ALLERGIES: No Known Allergies  HOME MEDICATIONS: Current Outpatient Medications  Medication Sig Dispense Refill   acetaminophen (TYLENOL) 500 MG tablet Take 500 mg by mouth as needed.     aspirin EC 81 MG tablet Take 81 mg by mouth daily. Swallow whole.     Calcium Polycarbophil (FIBER-CAPS PO) Take 1 capsule by mouth daily.     cholecalciferol (VITAMIN D3) 25 MCG (1000 UNIT) tablet Take 2,000 Units by mouth daily.     famotidine (PEPCID) 40 MG tablet Take 40 mg by mouth 2 (two) times daily.     Ginkgo Biloba 60 MG CAPS Take 120 mg by mouth daily.     melatonin 5 MG TABS Take 10 mg by mouth at bedtime.     Multiple Vitamins-Minerals (PRESERVISION AREDS 2) CAPS Take 2 capsules by mouth daily.     nadolol (CORGARD) 40 MG tablet Take 40 mg by mouth daily.     nortriptyline (PAMELOR) 25 MG capsule Take 2 capsules (50 mg total) by mouth at bedtime. 180 capsule 3  omeprazole (PRILOSEC) 40 MG capsule Take 40 mg by mouth daily.     Ubrogepant (UBRELVY) 50 MG TABS Take 50 mg by mouth as needed (take 1 tablet at onset of headache, may repeat 2 hours later if needed). 12 tablet 11   No current facility-administered medications for this visit.    PAST MEDICAL HISTORY: Past Medical History:  Diagnosis Date   Headache    Heart murmur    not treated..   Hypertension    TIA (transient ischemic attack)    2017    PAST SURGICAL HISTORY: Past Surgical History:  Procedure Laterality Date   BREAST CYST ASPIRATION Right    CHOLECYSTECTOMY     ESOPHAGOGASTRODUODENOSCOPY (EGD) WITH PROPOFOL N/A 04/17/2021   Procedure: ESOPHAGOGASTRODUODENOSCOPY (EGD) WITH PROPOFOL;  Surgeon: Carol Ada, MD;  Location: [facility];  Service: Endoscopy;  Laterality: N/A;   HYSTEROSCOPY WITH D & C N/A 12/29/2015   Procedure: DILATATION AND CURETTAGE /HYSTEROSCOPY;  Surgeon: Janyth Pupa, DO;  Location: [facility];  Service: Gynecology;   Laterality: N/A;   URINARY SURGERY     mesh    WRIST SURGERY     left thumb (pin)    FAMILY HISTORY: Family History  Problem Relation Age of Onset   Hypertension Mother    Stroke Father    Prostate cancer Brother    Hypertension Sister    High blood pressure Sister    Alzheimer's disease Sister     SOCIAL HISTORY: Social History   Socioeconomic History   Marital status: Married    Spouse name: Not on file   Number of children: 2   Years of education: some college   Highest education level: High school graduate  Occupational History   Occupation: Retired  Tobacco Use   Smoking status: Never   Smokeless tobacco: Never  Substance and Sexual Activity   Alcohol use: No   Drug use: No   Sexual activity: Not Currently  Other Topics Concern   Not on file  Social History Narrative   Lives at home with husband.   Right-handed.   No caffeine use.   Social Determinants of Health   Financial Resource Strain: Not on file  Food Insecurity: Not on file  Transportation Needs: Not on file  Physical Activity: Not on file  Stress: Not on file  Social Connections: Not on file  Intimate Partner Violence: Not on file      Marcial Pacas, M.D. Ph.D.  [facility] [address], [place], [place] [address] Ph: [phone] Fax: [phone]  CC:  Glenis Smoker, MD [place],  [place] [address]  Glenis Smoker, MD

## 2021-10-12 ENCOUNTER — Telehealth: Payer: Self-pay | Admitting: Neurology

## 2021-10-12 NOTE — Telephone Encounter (Signed)
[employer] medicare no auth require ref # [id number], sent Butch Penny a message she will reach out to the patient to schedule.

## 2021-10-21 ENCOUNTER — Ambulatory Visit (HOSPITAL_COMMUNITY)
Admission: RE | Admit: 2021-10-21 | Discharge: 2021-10-21 | Disposition: A | Discharge status: Home / Self Care | Payer: Medicare HMO | Source: Ambulatory Visit | Attending: Neurology | Admitting: Neurology

## 2021-10-21 ENCOUNTER — Other Ambulatory Visit: Payer: Self-pay

## 2021-10-21 DIAGNOSIS — H538 Other visual disturbances: Secondary | ICD-10-CM

## 2021-10-21 DIAGNOSIS — G43709 Chronic migraine without aura, not intractable, without status migrainosus: Secondary | ICD-10-CM | POA: Insufficient documentation

## 2021-10-21 NOTE — Progress Notes (Signed)
Carotid artery duplex has been completed. Preliminary results can be found in CV Proc through chart review.   10/21/21 10:10 AM Terri Turner RVT

## 2021-11-17 ENCOUNTER — Other Ambulatory Visit: Payer: Self-pay | Admitting: Family Medicine

## 2021-11-17 DIAGNOSIS — E2839 Other primary ovarian failure: Secondary | ICD-10-CM

## 2021-11-19 ENCOUNTER — Ambulatory Visit
Admission: RE | Admit: 2021-11-19 | Discharge: 2021-11-19 | Disposition: A | Discharge status: Home / Self Care | Payer: Medicare HMO | Source: Ambulatory Visit | Attending: Family Medicine | Admitting: Family Medicine

## 2021-11-19 DIAGNOSIS — E2839 Other primary ovarian failure: Secondary | ICD-10-CM

## 2022-01-02 ENCOUNTER — Other Ambulatory Visit: Payer: Self-pay | Admitting: Neurology

## 2022-01-02 IMAGING — MG MM DIGITAL SCREENING BILAT W/ TOMO AND CAD
8 series · 8 of 24 positions shown · non-contrast
Comparison: Previous exam(s).

CLINICAL DATA: Screening.

EXAM:
DIGITAL SCREENING BILATERAL MAMMOGRAM WITH TOMOSYNTHESIS AND CAD
TECHNIQUE: Bilateral screening digital craniocaudal and mediolateral oblique
mammograms were obtained. Bilateral screening digital breast
tomosynthesis was performed. The images were evaluated with
computer-aided detection.

[R CC synth-2D]
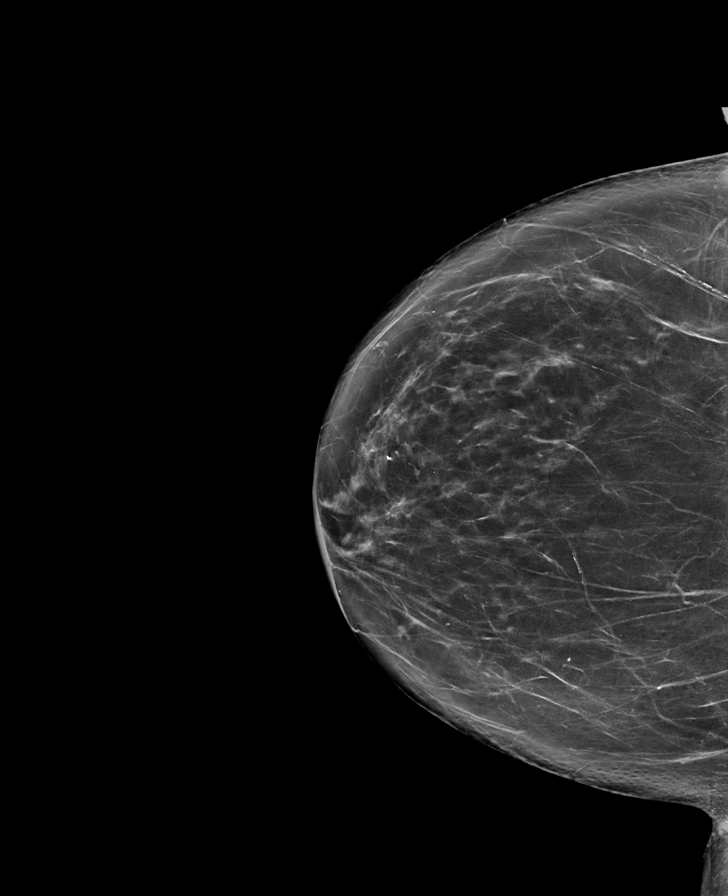

[R MLO synth-2D]
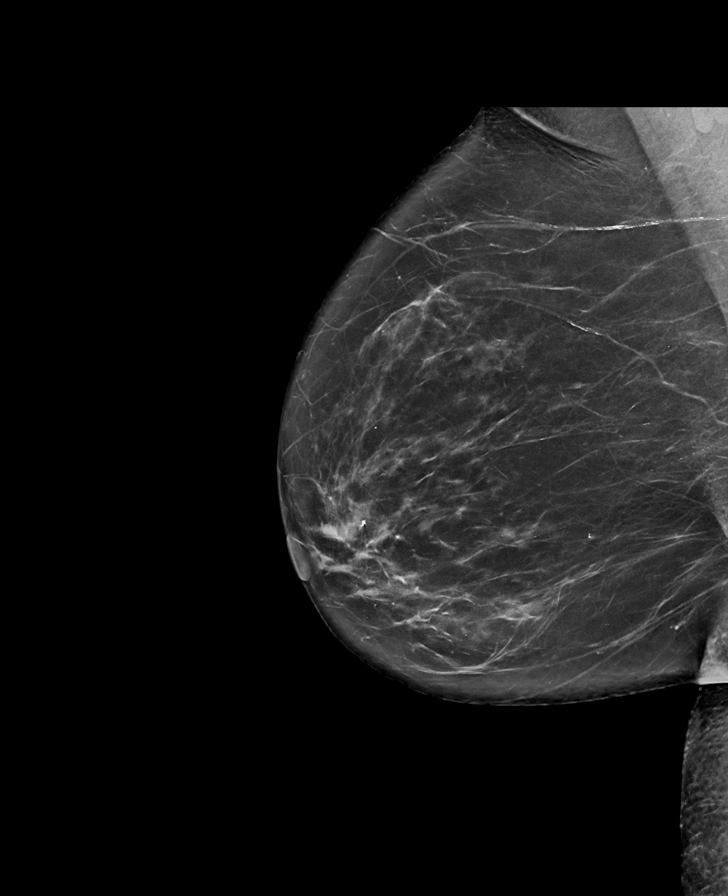

[L MLO synth-2D]
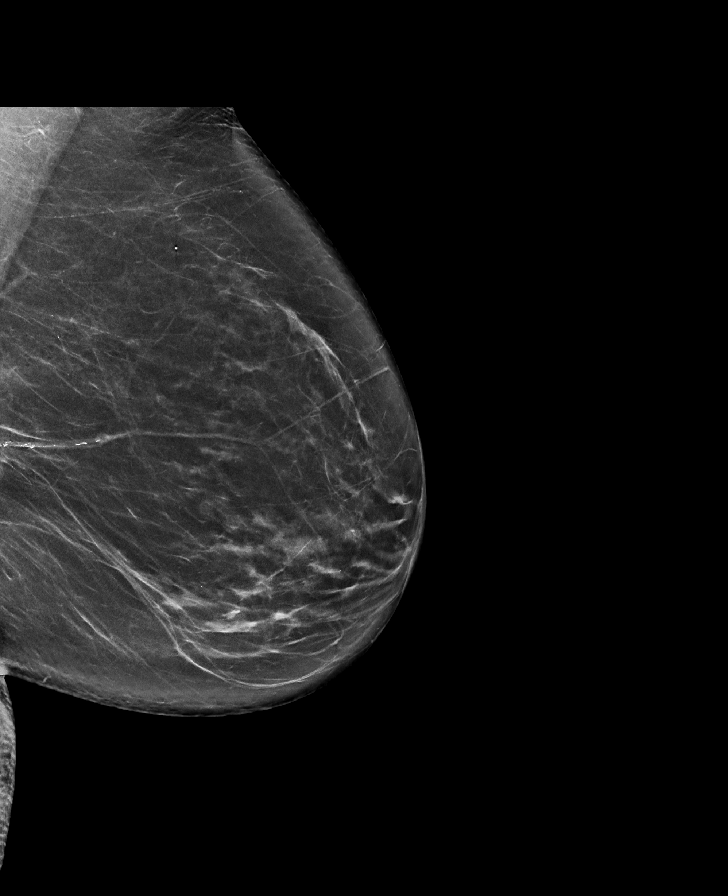

[L CC synth-2D]
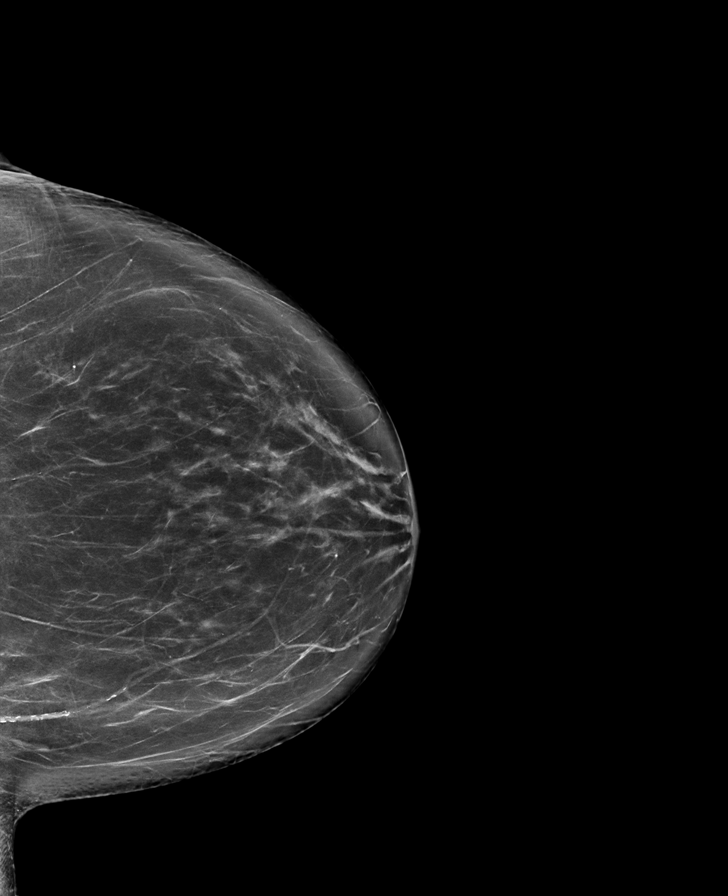

[L MLO tomo · tomo slice 41/82.0]
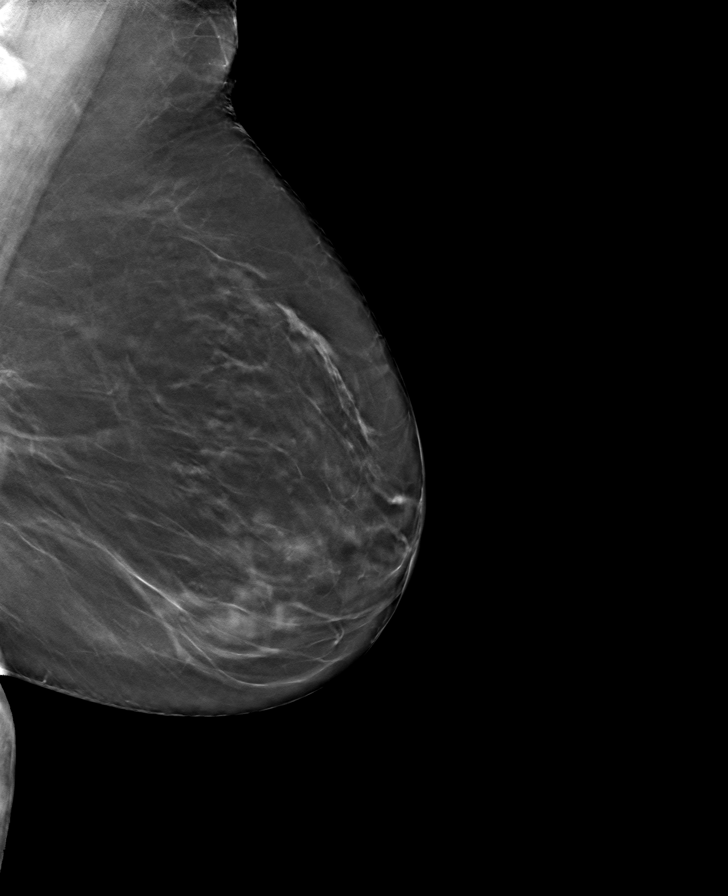

[L CC tomo · tomo slice 43/85.0]
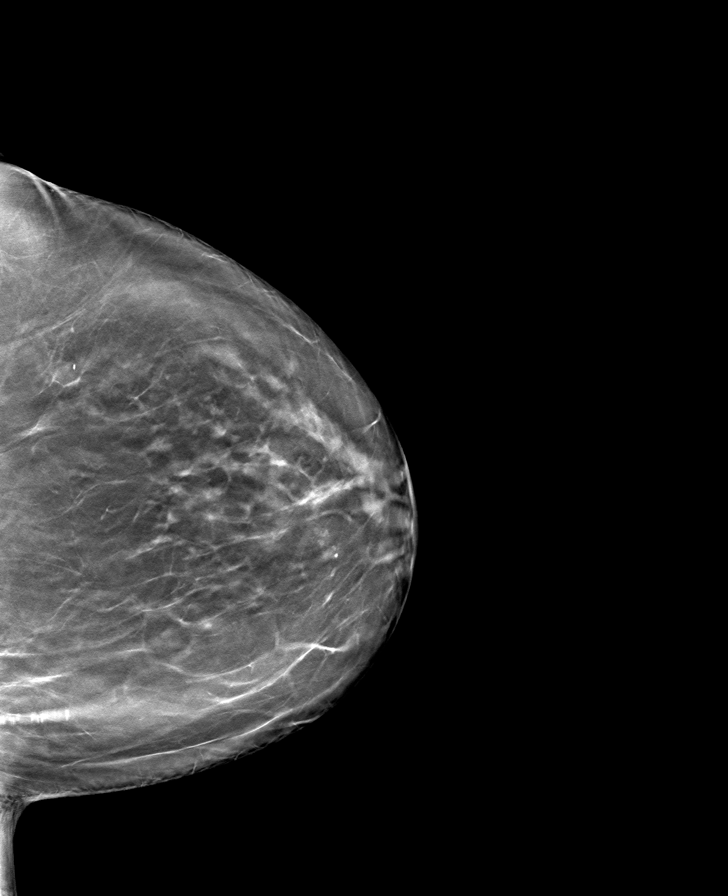

[R MLO tomo · tomo slice 42/83.0]
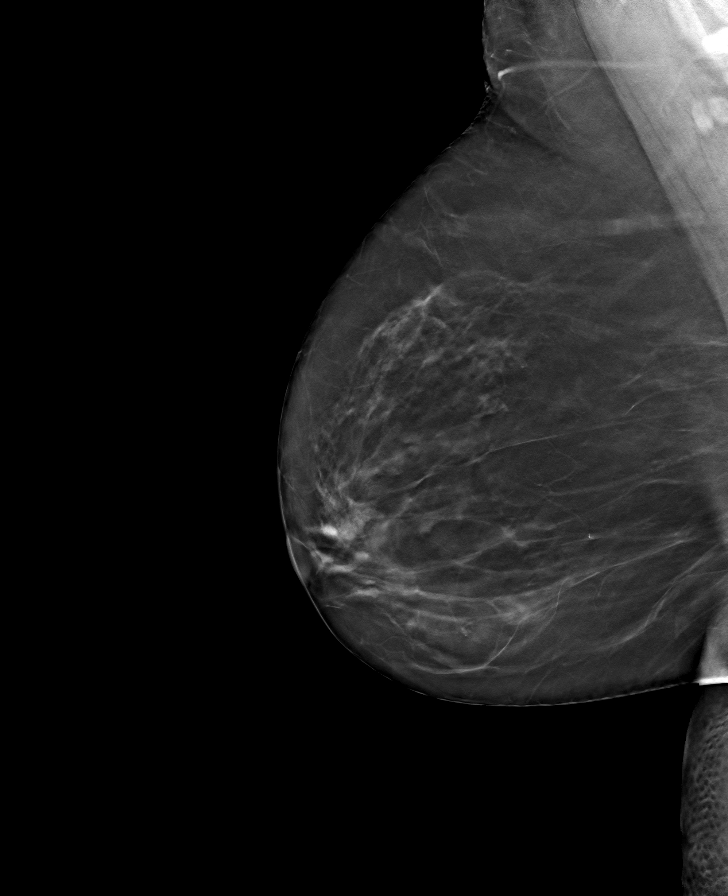

[R CC tomo · tomo slice 41/82.0]
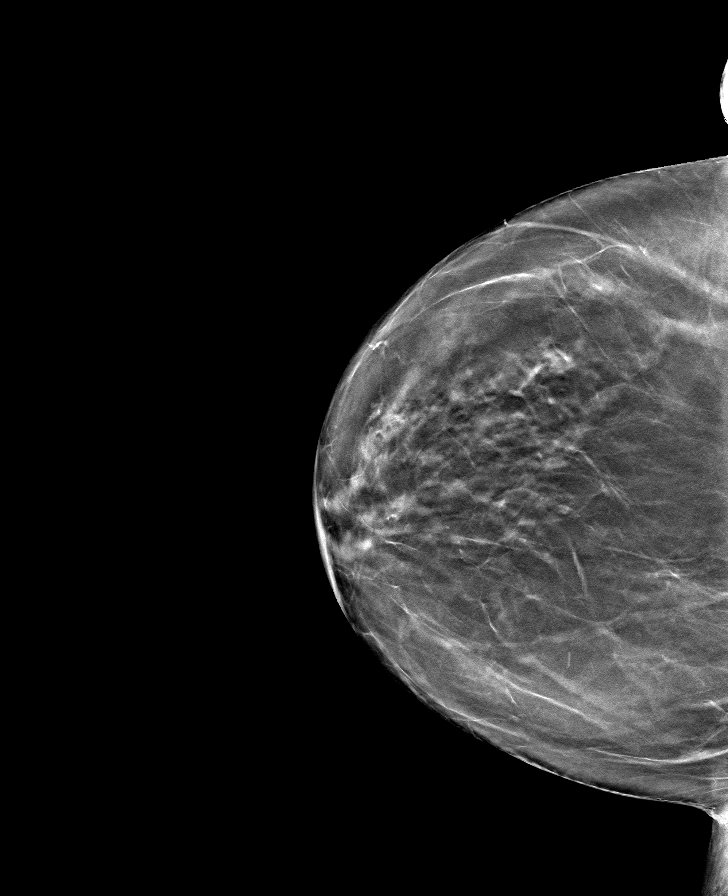

[8 of 24 positions shown; findings below may reference images not displayed]

ACR Breast Density Category b: There are scattered areas of
fibroglandular density.
FINDINGS: There are no findings suspicious for malignancy. The images were
evaluated with computer-aided detection.
IMPRESSION: No mammographic evidence of malignancy. A result letter of this
screening mammogram will be mailed directly to the patient.

RECOMMENDATION:
Screening mammogram in one year. (Code:WJ-I-BG6)

BI-RADS CATEGORY  1: Negative.

## 2022-03-30 ENCOUNTER — Other Ambulatory Visit: Payer: Self-pay | Admitting: Family Medicine

## 2022-03-30 DIAGNOSIS — Z1231 Encounter for screening mammogram for malignant neoplasm of breast: Secondary | ICD-10-CM

## 2022-04-30 IMAGING — US US ABDOMEN LIMITED
1 series · 14 of 25 positions shown · non-contrast
Comparison: 11/18/2017, CT 11/25/2017

CLINICAL DATA: Cirrhosis

EXAM:
ULTRASOUND ABDOMEN LIMITED RIGHT UPPER QUADRANT

[Series 1: us abdomen limited · 0.23mm/px · 14 of 29 slices shown]
[im 1/29]
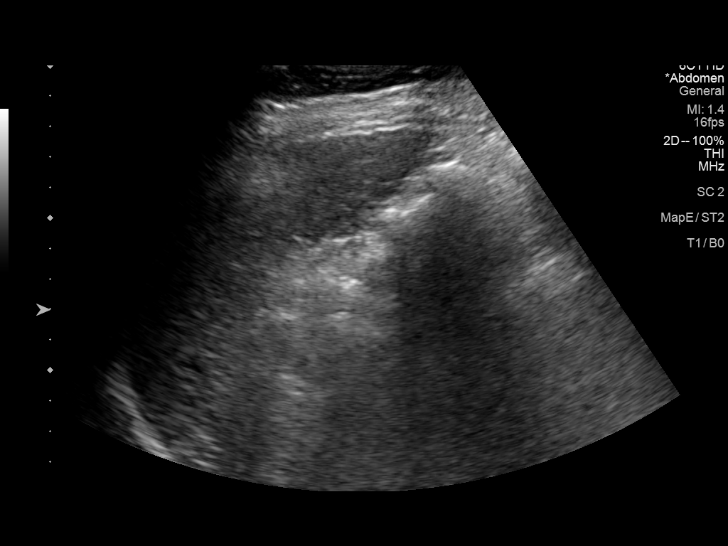
[im 3/29]
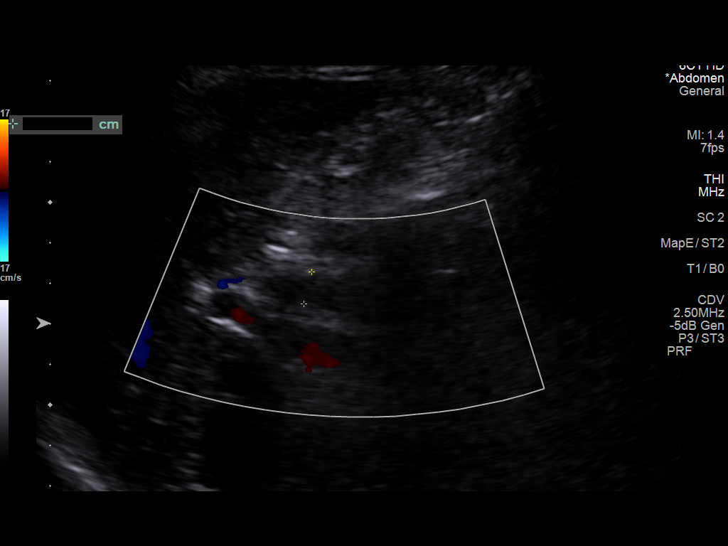
[im 5/29]
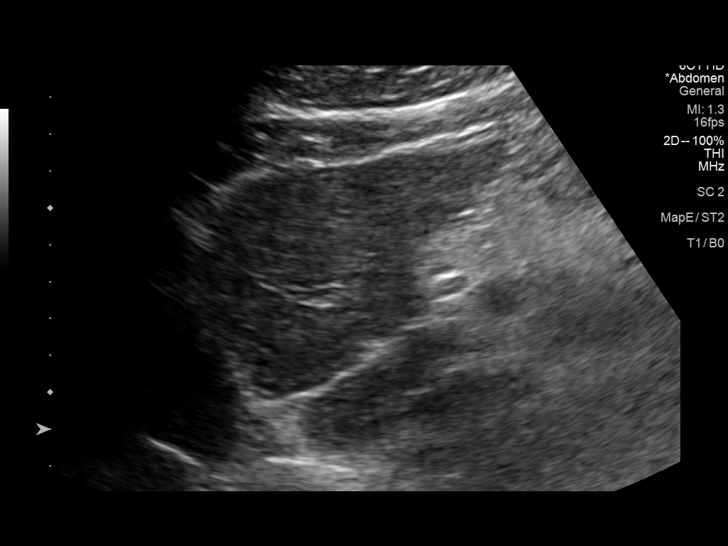
[im 8/29]
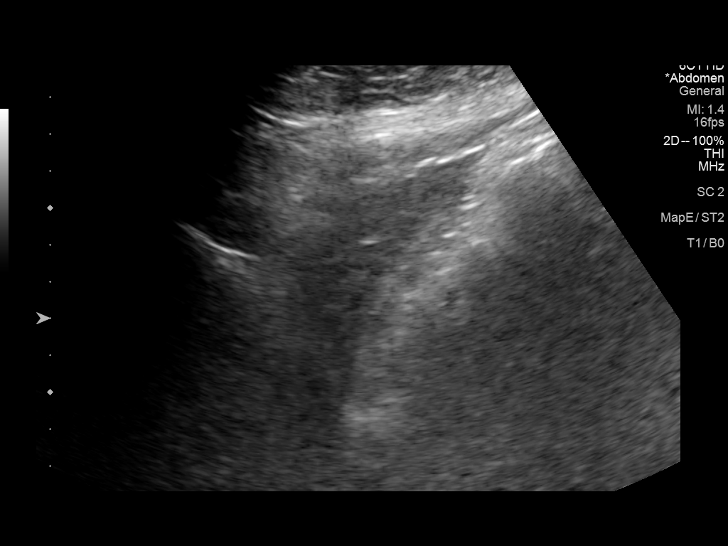
[im 10/29]
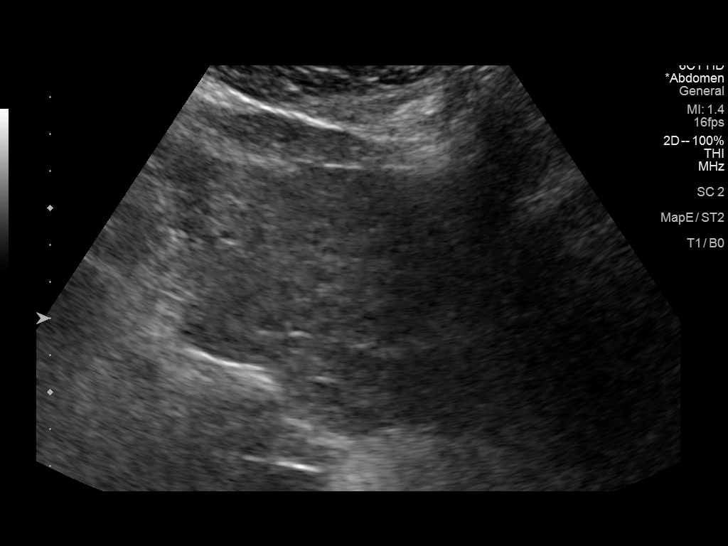
[im 11/29]
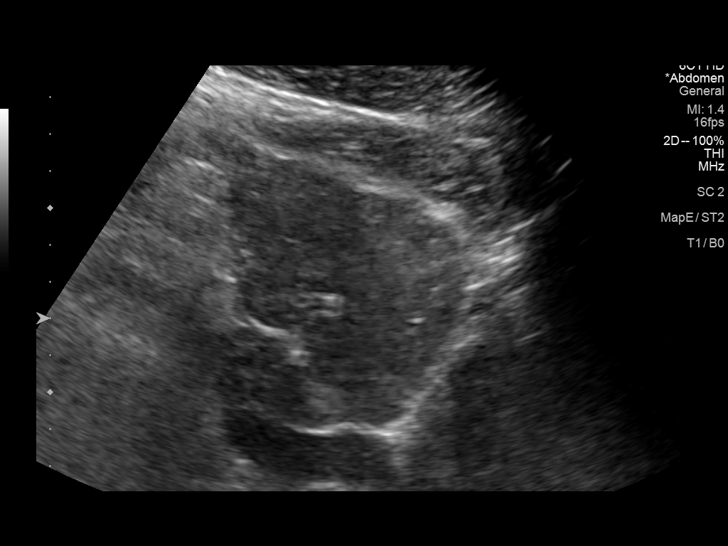
[im 13/29]
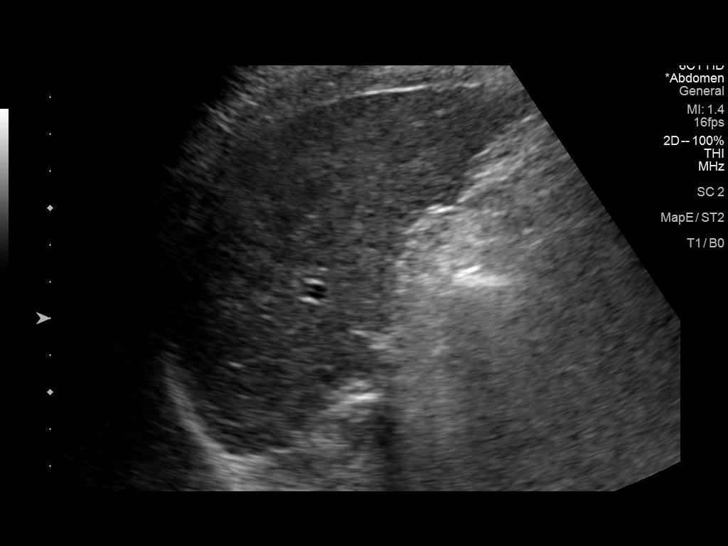
[im 16/29]
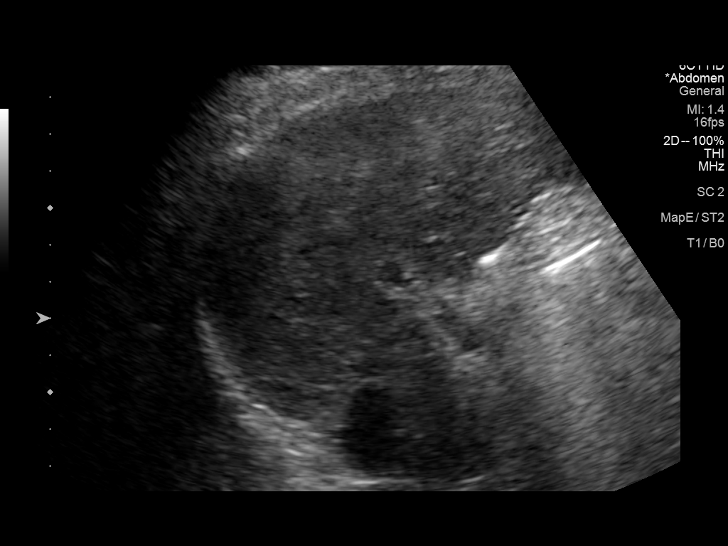
[im 18/29]
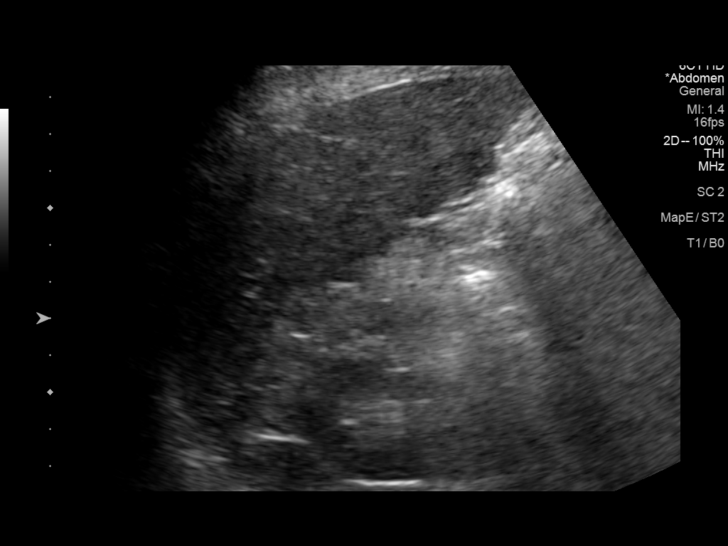
[im 19/29]
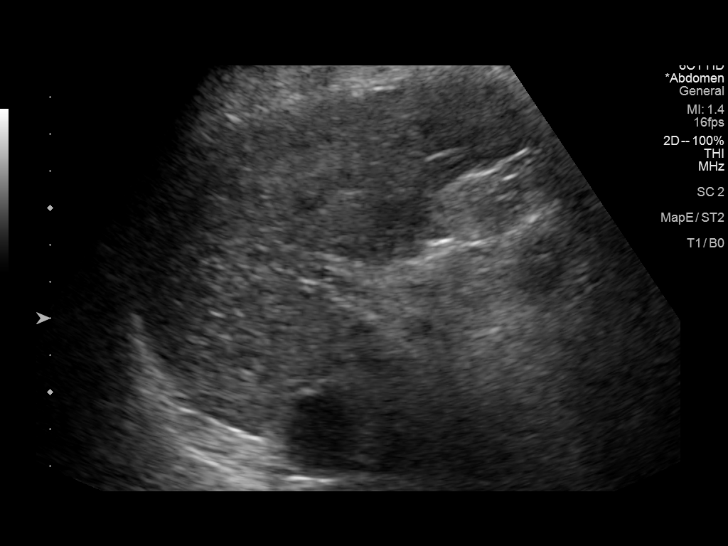
[im 22/29]
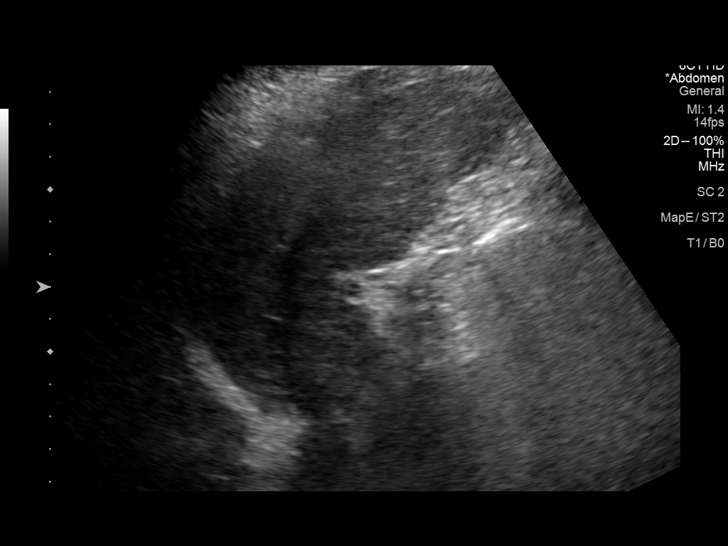
[im 24/29]
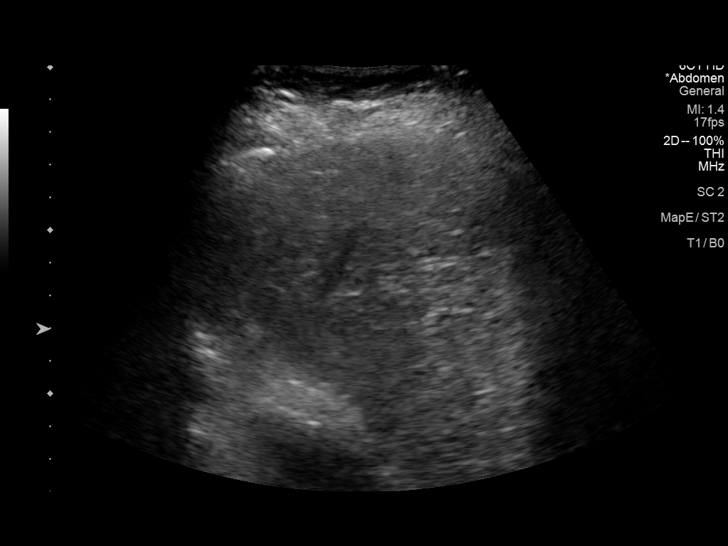
[im 26/29]
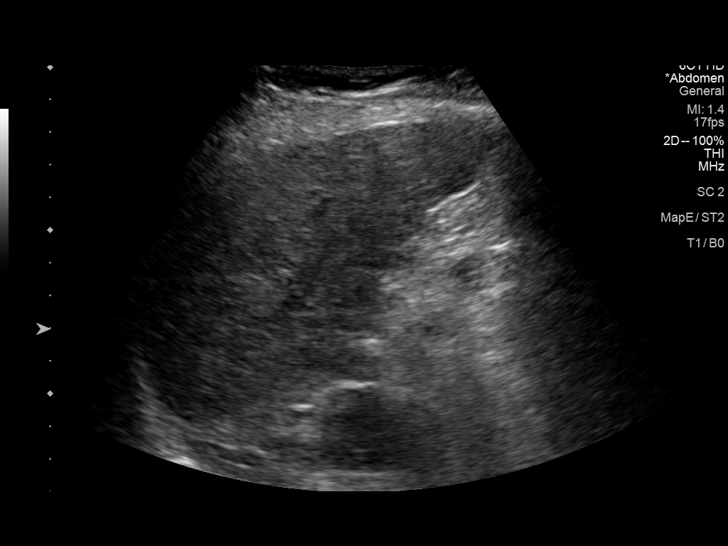
[im 29/29]
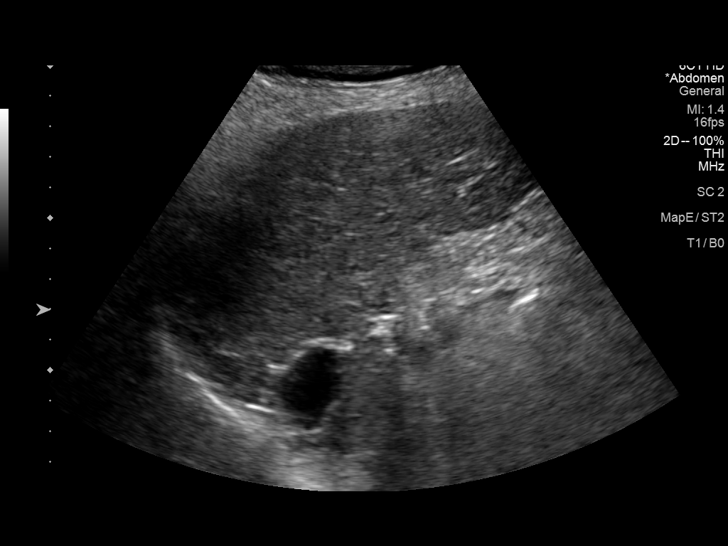

[14 of 25 positions shown; findings below may reference images not displayed]

FINDINGS: Gallbladder:

Absent

Common bile duct:

Diameter: 8 mm in proximal diameter

Liver:

The liver contour is nodular and the hepatic echotexture is
diffusely coarsened in keeping with changes of underlying cirrhosis.
No focal intrahepatic masses are seen. There is no intrahepatic
biliary ductal dilation. Portal vein is patent on color Doppler
imaging with normal direction of blood flow towards the liver.

Other: None.
IMPRESSION: Parenchymal changes in keeping with cirrhosis.  No hepatic mass.

Status post cholecystectomy.

## 2022-05-03 ENCOUNTER — Ambulatory Visit
Admission: RE | Admit: 2022-05-03 | Discharge: 2022-05-03 | Disposition: A | Discharge status: Home / Self Care | Payer: Medicare HMO | Source: Ambulatory Visit | Attending: Family Medicine | Admitting: Family Medicine

## 2022-05-03 DIAGNOSIS — Z1231 Encounter for screening mammogram for malignant neoplasm of breast: Secondary | ICD-10-CM

## 2023-02-24 ENCOUNTER — Other Ambulatory Visit: Payer: Self-pay | Admitting: Gastroenterology

## 2023-02-24 DIAGNOSIS — Z8719 Personal history of other diseases of the digestive system: Secondary | ICD-10-CM

## 2023-02-28 ENCOUNTER — Other Ambulatory Visit: Payer: Self-pay | Admitting: Gastroenterology

## 2023-03-24 ENCOUNTER — Ambulatory Visit
Admission: RE | Admit: 2023-03-24 | Discharge: 2023-03-24 | Disposition: A | Discharge status: Home / Self Care | Payer: Medicare HMO | Source: Ambulatory Visit | Attending: Gastroenterology | Admitting: Gastroenterology

## 2023-03-24 DIAGNOSIS — Z8719 Personal history of other diseases of the digestive system: Secondary | ICD-10-CM

## 2023-05-12 ENCOUNTER — Encounter (HOSPITAL_COMMUNITY): Payer: Self-pay | Admitting: Gastroenterology

## 2023-05-20 ENCOUNTER — Ambulatory Visit (HOSPITAL_COMMUNITY)
Admission: RE | Admit: 2023-05-20 | Discharge: 2023-05-20 | Disposition: A | Discharge status: Home / Self Care | Payer: Medicare HMO | Attending: Gastroenterology | Admitting: Gastroenterology

## 2023-05-20 ENCOUNTER — Encounter (HOSPITAL_COMMUNITY): Payer: Self-pay | Admitting: *Deleted

## 2023-05-20 ENCOUNTER — Encounter (HOSPITAL_COMMUNITY)
Admission: RE | Disposition: A | Discharge status: Home / Self Care | Payer: Self-pay | Source: Home / Self Care | Attending: Gastroenterology

## 2023-05-20 ENCOUNTER — Emergency Department (HOSPITAL_COMMUNITY): Payer: Medicare HMO

## 2023-05-20 ENCOUNTER — Emergency Department (HOSPITAL_COMMUNITY)
Admission: EM | Admit: 2023-05-20 | Discharge: 2023-05-20 | Disposition: A | Discharge status: Home / Self Care | Payer: Medicare HMO | Source: Home / Self Care | Attending: Emergency Medicine | Admitting: Emergency Medicine

## 2023-05-20 ENCOUNTER — Ambulatory Visit (HOSPITAL_BASED_OUTPATIENT_CLINIC_OR_DEPARTMENT_OTHER): Payer: Medicare HMO | Admitting: Anesthesiology

## 2023-05-20 ENCOUNTER — Encounter (HOSPITAL_COMMUNITY): Payer: Self-pay | Admitting: Gastroenterology

## 2023-05-20 ENCOUNTER — Ambulatory Visit (HOSPITAL_COMMUNITY): Payer: Medicare HMO | Admitting: Anesthesiology

## 2023-05-20 ENCOUNTER — Other Ambulatory Visit: Payer: Self-pay

## 2023-05-20 DIAGNOSIS — D123 Benign neoplasm of transverse colon: Secondary | ICD-10-CM | POA: Insufficient documentation

## 2023-05-20 DIAGNOSIS — I8500 Esophageal varices without bleeding: Secondary | ICD-10-CM

## 2023-05-20 DIAGNOSIS — I10 Essential (primary) hypertension: Secondary | ICD-10-CM

## 2023-05-20 DIAGNOSIS — K7460 Unspecified cirrhosis of liver: Secondary | ICD-10-CM | POA: Insufficient documentation

## 2023-05-20 DIAGNOSIS — Z9889 Other specified postprocedural states: Secondary | ICD-10-CM

## 2023-05-20 DIAGNOSIS — K5730 Diverticulosis of large intestine without perforation or abscess without bleeding: Secondary | ICD-10-CM | POA: Diagnosis not present

## 2023-05-20 DIAGNOSIS — K7581 Nonalcoholic steatohepatitis (NASH): Secondary | ICD-10-CM | POA: Insufficient documentation

## 2023-05-20 DIAGNOSIS — Z13810 Encounter for screening for upper gastrointestinal disorder: Secondary | ICD-10-CM | POA: Insufficient documentation

## 2023-05-20 DIAGNOSIS — K3189 Other diseases of stomach and duodenum: Secondary | ICD-10-CM

## 2023-05-20 DIAGNOSIS — I8510 Secondary esophageal varices without bleeding: Secondary | ICD-10-CM | POA: Insufficient documentation

## 2023-05-20 DIAGNOSIS — K766 Portal hypertension: Secondary | ICD-10-CM | POA: Diagnosis not present

## 2023-05-20 DIAGNOSIS — D126 Benign neoplasm of colon, unspecified: Secondary | ICD-10-CM

## 2023-05-20 DIAGNOSIS — K921 Melena: Secondary | ICD-10-CM | POA: Diagnosis not present

## 2023-05-20 DIAGNOSIS — Z8673 Personal history of transient ischemic attack (TIA), and cerebral infarction without residual deficits: Secondary | ICD-10-CM

## 2023-05-20 DIAGNOSIS — D120 Benign neoplasm of cecum: Secondary | ICD-10-CM | POA: Insufficient documentation

## 2023-05-20 DIAGNOSIS — K219 Gastro-esophageal reflux disease without esophagitis: Secondary | ICD-10-CM | POA: Insufficient documentation

## 2023-05-20 DIAGNOSIS — R072 Precordial pain: Secondary | ICD-10-CM | POA: Insufficient documentation

## 2023-05-20 HISTORY — PX: POLYPECTOMY: SHX5525

## 2023-05-20 HISTORY — PX: ESOPHAGEAL BANDING: SHX5518

## 2023-05-20 HISTORY — PX: ESOPHAGOGASTRODUODENOSCOPY (EGD) WITH PROPOFOL: SHX5813

## 2023-05-20 HISTORY — PX: COLONOSCOPY WITH PROPOFOL: SHX5780

## 2023-05-20 LAB — COMPREHENSIVE METABOLIC PANEL
ALT: 40 U/L (ref 0–44)
AST: 58 U/L — ABNORMAL HIGH (ref 15–41)
Albumin: 3.8 g/dL (ref 3.5–5.0)
Alkaline Phosphatase: 154 U/L — ABNORMAL HIGH (ref 38–126)
Anion gap: 14 (ref 5–15)
BUN: 20 mg/dL (ref 8–23)
CO2: 19 mmol/L — ABNORMAL LOW (ref 22–32)
Calcium: 8.8 mg/dL — ABNORMAL LOW (ref 8.9–10.3)
Chloride: 104 mmol/L (ref 98–111)
Creatinine, Ser: 0.79 mg/dL (ref 0.44–1.00)
GFR, Estimated: >60 mL/min (ref >60)
Glucose, Bld: 101 mg/dL — ABNORMAL HIGH (ref 70–99)
Potassium: 4.2 mmol/L (ref 3.5–5.1)
Sodium: 137 mmol/L (ref 135–145)
Total Bilirubin: 3.1 mg/dL — ABNORMAL HIGH (ref 0.3–1.2)
Total Protein: 7.8 g/dL (ref 6.5–8.1)

## 2023-05-20 LAB — LIPASE, BLOOD: Lipase: 57 U/L — ABNORMAL HIGH (ref 11–51)

## 2023-05-20 LAB — TROPONIN I (HIGH SENSITIVITY)
Troponin I (High Sensitivity): 10 ng/L (ref <18)
Troponin I (High Sensitivity): 9 ng/L (ref <18)

## 2023-05-20 SURGERY — ESOPHAGOGASTRODUODENOSCOPY (EGD) WITH PROPOFOL
Anesthesia: Monitor Anesthesia Care

## 2023-05-20 MED ORDER — LIDOCAINE 2% (20 MG/ML) 5 ML SYRINGE
INTRAMUSCULAR | Status: DC | PRN
  Administered 2023-05-20: 100 mg via INTRAVENOUS

## 2023-05-20 MED ORDER — FENTANYL CITRATE (PF) 100 MCG/2ML IJ SOLN
25.0000 ug | Freq: Once | INTRAMUSCULAR | Status: AC
  Administered 2023-05-20: 25 ug via INTRAVENOUS

## 2023-05-20 MED ORDER — LACTATED RINGERS IV SOLN
INTRAVENOUS | Status: AC | PRN
  Administered 2023-05-20: 1000 mL via INTRAVENOUS

## 2023-05-20 MED ORDER — MORPHINE SULFATE (PF) 2 MG/ML IV SOLN
2.0000 mg | Freq: Once | INTRAVENOUS | Status: AC
  Administered 2023-05-20: 2 mg via INTRAVENOUS
  Filled 2023-05-20: qty 1

## 2023-05-20 MED ORDER — SODIUM CHLORIDE 0.9 % IV SOLN: INTRAVENOUS | Status: DC

## 2023-05-20 MED ORDER — FENTANYL CITRATE (PF) 100 MCG/2ML IJ SOLN
INTRAMUSCULAR | Status: AC
  Filled 2023-05-20: qty 2

## 2023-05-20 MED ORDER — PANTOPRAZOLE SODIUM 40 MG IV SOLR
40.0000 mg | Freq: Once | INTRAVENOUS | Status: AC
  Administered 2023-05-20: 40 mg via INTRAVENOUS
  Filled 2023-05-20: qty 10

## 2023-05-20 MED ORDER — PROPOFOL 1000 MG/100ML IV EMUL
INTRAVENOUS | Status: AC
  Filled 2023-05-20: qty 100

## 2023-05-20 MED ORDER — ONDANSETRON HCL 4 MG/2ML IJ SOLN
4.0000 mg | Freq: Once | INTRAMUSCULAR | Status: AC
  Administered 2023-05-20: 4 mg via INTRAVENOUS
  Filled 2023-05-20: qty 2

## 2023-05-20 MED ORDER — PROPOFOL 10 MG/ML IV BOLUS
INTRAVENOUS | Status: DC | PRN
  Administered 2023-05-20: 10 mg via INTRAVENOUS
  Administered 2023-05-20: 20 mg via INTRAVENOUS
  Administered 2023-05-20: 10 mg via INTRAVENOUS

## 2023-05-20 MED ORDER — PROPOFOL 500 MG/50ML IV EMUL
INTRAVENOUS | Status: DC | PRN
  Administered 2023-05-20: 100 ug/kg/min via INTRAVENOUS

## 2023-05-20 MED ORDER — SODIUM CHLORIDE (PF) 0.9 % IJ SOLN
INTRAMUSCULAR | Status: AC
  Filled 2023-05-20: qty 50

## 2023-05-20 MED ORDER — IOHEXOL 350 MG/ML SOLN
80.0000 mL | Freq: Once | INTRAVENOUS | Status: AC | PRN
  Administered 2023-05-20: 80 mL via INTRAVENOUS

## 2023-05-20 MED ORDER — ALUM & MAG HYDROXIDE-SIMETH 200-200-20 MG/5ML PO SUSP
30.0000 mL | Freq: Once | ORAL | Status: AC
  Administered 2023-05-20: 30 mL via ORAL
  Filled 2023-05-20: qty 30

## 2023-05-20 SURGICAL SUPPLY — 25 items

## 2023-05-20 NOTE — Anesthesia Preprocedure Evaluation (Signed)
Anesthesia Evaluation  Patient identified by MRN, date of birth, ID band Patient awake    Reviewed: Allergy & Precautions, H&P , NPO status , Patient's Chart, lab work & pertinent test results  Airway Mallampati: II  TM Distance: >3 FB Neck ROM: Full    Dental no notable dental hx.    Pulmonary neg pulmonary ROS   Pulmonary exam normal breath sounds clear to auscultation       Cardiovascular hypertension, Normal cardiovascular exam Rhythm:Regular Rate:Normal     Neuro/Psych TIA negative psych ROS   GI/Hepatic negative GI ROS, Neg liver ROS,,,  Endo/Other  negative endocrine ROS    Renal/GU negative Renal ROS  negative genitourinary   Musculoskeletal negative musculoskeletal ROS (+)    Abdominal   Peds negative pediatric ROS (+)  Hematology negative hematology ROS (+)   Anesthesia Other Findings   Reproductive/Obstetrics negative OB ROS                             Anesthesia Physical Anesthesia Plan  ASA: 3  Anesthesia Plan: MAC   Post-op Pain Management: Minimal or no pain anticipated   Induction: Intravenous  PONV Risk Score and Plan: 2 and Propofol infusion and Treatment may vary due to age or medical condition  Airway Management Planned: Nasal Cannula  Additional Equipment:   Intra-op Plan:   Post-operative Plan:   Informed Consent: I have reviewed the patients History and Physical, chart, labs and discussed the procedure including the risks, benefits and alternatives for the proposed anesthesia with the patient or authorized representative who has indicated his/her understanding and acceptance.     Dental advisory given  Plan Discussed with: CRNA and Surgeon  Anesthesia Plan Comments:        Anesthesia Quick Evaluation

## 2023-05-20 NOTE — Anesthesia Postprocedure Evaluation (Signed)
Anesthesia Post Note  Patient: Terri Turner  Procedure(s) Performed: ESOPHAGOGASTRODUODENOSCOPY (EGD) WITH PROPOFOL COLONOSCOPY WITH PROPOFOL ESOPHAGEAL BANDING POLYPECTOMY     Patient location during evaluation: PACU Anesthesia Type: MAC Level of consciousness: awake and alert Pain management: pain level controlled Vital Signs Assessment: post-procedure vital signs reviewed and stable Respiratory status: spontaneous breathing, nonlabored ventilation, respiratory function stable and patient connected to nasal cannula oxygen Cardiovascular status: stable and blood pressure returned to baseline Postop Assessment: no apparent nausea or vomiting Anesthetic complications: no  No notable events documented.  Last Vitals:  Vitals:   05/20/23 0811 05/20/23 0815  BP: (!) 98/54   Pulse: (!) 55 (!) 56  Resp: 20 12  Temp: 36.5 C   SpO2: 96% 96%    Last Pain:  Vitals:   05/20/23 0811  TempSrc:   PainSc: 0-No pain                 Quadarius Henton S

## 2023-05-20 NOTE — Discharge Instructions (Signed)

## 2023-05-20 NOTE — ED Notes (Signed)
Lab called regarding trop result. Lab states 20 more minutes for test to result. Dr. Denton Lank notified

## 2023-05-20 NOTE — ED Notes (Signed)
Pt resting quietly in bed at present.

## 2023-05-20 NOTE — ED Triage Notes (Addendum)
Pt is here for epigastric and chest pain and sob and spitting up which began after EGD. Pt has has HX of GERD and appears very uncomfortable, has a hard time answering questions

## 2023-05-20 NOTE — ED Notes (Signed)
Pt states no relief in pain and states she is sob. Dr. Denton Lank notified

## 2023-05-20 NOTE — Op Note (Signed)
[facility] Patient Name: Terri Turner Procedure Date: 05/20/2023 MRN: [id number] Attending MD: Jeani Hawking , MD, [id number] Date of Birth: 1946/01/07 CSN: [id number] Age: 77 Admit Type: Outpatient Procedure:                Colonoscopy Indications:              Hematochezia Providers:                Jeani Hawking, MD, Fransisca Connors, Salley Scarlet, Technician, Albertina Senegal. Alday CRNA, CRNA Referring MD:              Medicines:                Propofol per Anesthesia Complications:            No immediate complications. Estimated Blood Loss:     Estimated blood loss: none. Procedure:                Pre-Anesthesia Assessment:                           - Prior to the procedure, a History and Physical                            was performed, and patient medications and                            allergies were reviewed. The patient's tolerance of                            previous anesthesia was also reviewed. The risks                            and benefits of the procedure and the sedation                            options and risks were discussed with the patient.                            All questions were answered, and informed consent                            was obtained. Prior Anticoagulants: The patient has                            taken no anticoagulant or antiplatelet agents. ASA                            Grade Assessment: III - A patient with severe                            systemic disease. After reviewing the risks and  benefits, the patient was deemed in satisfactory                            condition to undergo the procedure.                           - Sedation was administered by an anesthesia                            professional. Deep sedation was attained.                           After obtaining informed consent, the colonoscope                            was passed under direct  vision. Throughout the                            procedure, the patient's blood pressure, pulse, and                            oxygen saturations were monitored continuously. The                            CF-HQ190L ([id number]) Olympus colonoscope was                            introduced through the anus and advanced to the the                            cecum, identified by appendiceal orifice and                            ileocecal valve. The colonoscopy was performed                            without difficulty. The patient tolerated the                            procedure well. The quality of the bowel                            preparation was evaluated using the BBPS [facility]                            Bowel Preparation Scale) with scores of: Right                            Colon = 2 (minor amount of residual staining, small                            fragments of stool and/or opaque liquid, but mucosa  seen well), Transverse Colon = 2 (minor amount of                            residual staining, small fragments of stool and/or                            opaque liquid, but mucosa seen well) and Left Colon                            = 2 (minor amount of residual staining, small                            fragments of stool and/or opaque liquid, but mucosa                            seen well). The total BBPS score equals 6. The                            quality of the bowel preparation was good. The                            ileocecal valve, appendiceal orifice, and rectum                            were photographed. Scope In: 7:43:42 AM Scope Out: 8:00:44 AM Scope Withdrawal Time: 0 hours 10 minutes 58 seconds  Total Procedure Duration: 0 hours 17 minutes 2 seconds  Findings:      Two sessile polyps were found in the hepatic flexure and cecum. The       polyps were 2 to 4 mm in size. These polyps were removed with a cold       snare. Resection and  retrieval were complete.      Scattered medium-mouthed and small-mouthed diverticula were found in the       sigmoid colon, descending colon and transverse colon. Impression:               - Two 2 to 4 mm polyps at the hepatic flexure and                            in the cecum, removed with a cold snare. Resected                            and retrieved.                           - Diverticulosis in the sigmoid colon, in the                            descending colon and in the transverse colon. Moderate Sedation:      Not Applicable - Patient had care per Anesthesia. Recommendation:           - Patient has a contact number available for  emergencies. The signs and symptoms of potential                            delayed complications were discussed with the                            patient. Return to normal activities tomorrow.                            Written discharge instructions were provided to the                            patient.                           - Resume previous diet.                           - Continue present medications.                           - Await pathology results.                           - Repeat colonoscopy is not recommended for                            surveillance. Procedure Code(s):        --- Professional ---                           [phone], Colonoscopy, flexible; with removal of                            tumor(s), polyp(s), or other lesion(s) by snare                            technique Diagnosis Code(s):        --- Professional ---                           D12.3, Benign neoplasm of transverse colon (hepatic                            flexure or splenic flexure)                           D12.0, Benign neoplasm of cecum                           K92.1, Melena (includes Hematochezia)                           K57.30, Diverticulosis of large intestine without                            perforation or abscess  without bleeding CPT copyright 2022 American Medical Association.  All rights reserved. The codes documented in this report are preliminary and upon coder review may  be revised to meet current compliance requirements. Jeani Hawking, MD Jeani Hawking, MD 05/20/2023 8:08:56 AM This report has been signed electronically. Number of Addenda: 0

## 2023-05-20 NOTE — Transfer of Care (Signed)
Immediate Anesthesia Transfer of Care Note  Patient: Terri Turner  Procedure(s) Performed: ESOPHAGOGASTRODUODENOSCOPY (EGD) WITH PROPOFOL COLONOSCOPY WITH PROPOFOL ESOPHAGEAL BANDING POLYPECTOMY  Patient Location: PACU  Anesthesia Type:MAC  Level of Consciousness: sedated  Airway & Oxygen Therapy: Patient Spontanous Breathing and Patient connected to face mask oxygen  Post-op Assessment: Report given to RN and Post -op Vital signs reviewed and stable  Post vital signs: Reviewed and stable  Last Vitals:  Vitals Value Taken Time  BP    Temp    Pulse    Resp    SpO2      Last Pain:  Vitals:   05/20/23 0657  TempSrc: Tympanic  PainSc: 0-No pain         Complications: No notable events documented.

## 2023-05-20 NOTE — ED Notes (Signed)
Pt still complaining of pain. Dr. Denton Lank notified

## 2023-05-20 NOTE — Discharge Instructions (Addendum)
It was our pleasure to provide your ER care today - we hope that you feel better.  Drink adequate fluids/stay well hydrated.   Follow up with your doctor/gi doctor in the coming week.  Return to ER if worse, new symptoms, fevers, new/severe pain, chest pain, trouble breathing, new or worsening abdominal pain, persistent vomiting, or other concern.  You were given pain meds in the ER - no driving for the next 6 hours.

## 2023-05-20 NOTE — ED Provider Notes (Signed)
[facility] Provider Note   CSN: [id number] Arrival date & time: 05/20/23  1108     History  Chief Complaint  Patient presents with   Chest Pain    Terri Turner is a 77 y.o. female.  Patient c/o severe pain to upper abdomen/lower chest/xiphoid area just prior to ED presentation.  Denies hx similar pain. Dull, severe, non radiating. Had EGD earlier today - pt denies complications, states felt fine afterwards (on record review, had banding of varices).  Denies back pain. No mid to upper chest pain. No hx any exertional chest pain. No pleuritic pain. No sob or unusual doe. +associated nausea and spitting up small amount of mucous/saliva. No bloody emesis. No diaphoresis. No fever or chills.   The history is provided by the patient and medical records. The history is limited by the condition of the patient.  Chest Pain Associated symptoms: abdominal pain and nausea   Associated symptoms: no back pain, no cough, no fever, no headache and no shortness of breath        Home Medications Prior to Admission medications   Medication Sig Start Date End Date Taking? Authorizing Provider  Ascorbic Acid (VITAMIN C PO) Take 1 tablet by mouth 3 (three) times a week.    [provider]  Brimonidine Tartrate (LUMIFY) 0.025 % SOLN Place 1 drop into both eyes daily.    [provider]  Calcium Carb-Cholecalciferol (CALCIUM + VITAMIN D3 PO) Take 1 tablet by mouth in the morning.    [provider]  carboxymethylcellulose (REFRESH PLUS) 0.5 % SOLN Place 1 drop into both eyes in the morning.    [provider]  famotidine (PEPCID) 40 MG tablet Take 40 mg by mouth in the morning. 10/21/20   [provider]  GINKGO BILOBA PO Take 2 capsules by mouth in the morning.    [provider]  Multiple Vitamins-Minerals (PRESERVISION AREDS 2) CAPS Take 2 capsules by mouth in the morning.    [provider]  nadolol (CORGARD) 40 MG tablet Take 40 mg by mouth in the morning.    [provider]  Ubrogepant (UBRELVY) 50 MG TABS Take 50 mg by mouth as needed (take 1 tablet at onset of headache, may repeat 2 hours later if needed). 11/19/20   Glean Salvo, NP      Allergies    Patient has no known allergies.    Review of Systems   Review of Systems  Constitutional:  Negative for chills and fever.  HENT:  Negative for sore throat.   Eyes:  Negative for redness.  Respiratory:  Negative for cough and shortness of breath.   Cardiovascular:  Positive for chest pain.  Gastrointestinal:  Positive for abdominal pain and nausea.  Genitourinary:  Negative for dysuria and flank pain.  Musculoskeletal:  Negative for back pain and neck pain.  Skin:  Negative for rash.  Neurological:  Negative for headaches.  Hematological:  Does not bruise/bleed easily.  Psychiatric/Behavioral:  Negative for confusion. The patient is nervous/anxious.     Physical Exam Updated Vital Signs BP 127/65   Pulse (!) 57   Temp (!) 97.5 F (36.4 C) (Oral)   Resp 13   SpO2 95%  Physical Exam Vitals and nursing note reviewed.  Constitutional:      Appearance: Normal appearance. She is well-developed.  HENT:     Head: Atraumatic.     Nose: Nose normal.  Mouth/Throat:     Mouth: Mucous membranes are moist.  Eyes:     General: No scleral icterus.    Conjunctiva/sclera: Conjunctivae normal.  Neck:     Trachea: No tracheal deviation.  Cardiovascular:     Rate and Rhythm: Normal rate and regular rhythm.     Pulses: Normal pulses.     Heart sounds: Normal heart sounds. No murmur heard.    No friction rub. No gallop.  Pulmonary:     Effort: Pulmonary effort is normal. No respiratory distress.     Breath sounds: Normal breath sounds.     Comments: No auscultation chest, no abnormal sounds noted.  Abdominal:     General: Bowel sounds are normal. There is no distension.     Palpations: Abdomen is soft.  There is no mass.     Tenderness: There is no abdominal tenderness. There is no guarding or rebound.  Genitourinary:    Comments: No cva tenderness.  Musculoskeletal:        General: No swelling or tenderness.     Cervical back: Normal range of motion and neck supple. No rigidity. No muscular tenderness.     Right lower leg: No edema.     Left lower leg: No edema.  Skin:    General: Skin is warm and dry.     Findings: No rash.  Neurological:     Mental Status: She is alert.     Comments: Alert, speech normal.   Psychiatric:     Comments: Anxious appearing     ED Results / Procedures / Treatments   Labs (all labs ordered are listed, but only abnormal results are displayed) Results for orders placed or performed during the hospital encounter of 05/20/23  Comprehensive metabolic panel  Result Value Ref Range   Sodium 137 135 - 145 mmol/L   Potassium 4.2 3.5 - 5.1 mmol/L   Chloride 104 98 - 111 mmol/L   CO2 19 (L) 22 - 32 mmol/L   Glucose, Bld 101 (H) 70 - 99 mg/dL   BUN 20 8 - 23 mg/dL   Creatinine, Ser 9.60 0.44 - 1.00 mg/dL   Calcium 8.8 (L) 8.9 - 10.3 mg/dL   Total Protein 7.8 6.5 - 8.1 g/dL   Albumin 3.8 3.5 - 5.0 g/dL   AST 58 (H) 15 - 41 U/L   ALT 40 0 - 44 U/L   Alkaline Phosphatase 154 (H) 38 - 126 U/L   Total Bilirubin 3.1 (H) 0.3 - 1.2 mg/dL   GFR, Estimated >45 >40 mL/min   Anion gap 14 5 - 15  Lipase, blood  Result Value Ref Range   Lipase 57 (H) 11 - 51 U/L  Troponin I (High Sensitivity)  Result Value Ref Range   Troponin I (High Sensitivity) 10 <18 ng/L  Troponin I (High Sensitivity)  Result Value Ref Range   Troponin I (High Sensitivity) 9 <18 ng/L      EKG EKG Interpretation  Date/Time:  Friday May 20 2023 11:21:22 EDT Ventricular Rate:  57 PR Interval:  130 QRS Duration: 98 QT Interval:  510 QTC Calculation: 497 R Axis:   66 Text Interpretation: Sinus rhythm Borderline prolonged QT interval Non-specific ST-t changes Confirmed by Cathren Laine ([id number]) on 05/20/2023 11:23:21 AM  Radiology CT Angio Chest/Abd/Pel for Dissection W and/or W/WO  Result Date: 05/20/2023 CLINICAL DATA:  Severe pain. History of cirrhosis. Status post EGD today. EXAM: CT ANGIOGRAPHY CHEST, ABDOMEN AND PELVIS TECHNIQUE: Non-contrast CT of the chest  was initially obtained. Multidetector CT imaging through the chest, abdomen and pelvis was performed using the standard protocol during bolus administration of intravenous contrast. Multiplanar reconstructed images and MIPs were obtained and reviewed to evaluate the vascular anatomy. RADIATION DOSE REDUCTION: This exam was performed according to the departmental dose-optimization program which includes automated exposure control, adjustment of the mA and/or kV according to patient size and/or use of iterative reconstruction technique. CONTRAST:  80mL OMNIPAQUE IOHEXOL 350 MG/ML SOLN COMPARISON:  CT abdomen pelvis 11/25/2017. Chest x-ray earlier 05/20/2023. Abdominal ultrasound 08/26/2021. FINDINGS: CTA CHEST FINDINGS Cardiovascular: On the precontrast dataset there is no intramural hematoma suggested along the thoracic aorta at this time. The thoracic aorta has some mild partially calcified atherosclerotic plaque diffusely. No dissection or aneurysm formation. There is bovine type aortic arch. There is also separate origin of the left vertebral artery directly from the aortic arch proximal to the left subclavian artery origin. Normal variant. Diameter of the very proximal ascending aorta approaches 2.2 cm. Diameter of the mid ascending aorta at the level of the right pulmonary artery has a diameter of 3.2 by 3.5 cm. Descending thoracic aorta at the same level measures 2.2 by 2.3 cm. Diameter of the aortic arch measures 2.6 cm in diameter. There is some pulsation artifact along the ascending aorta. No mediastinal hematoma. Few coronary artery calcifications are seen. Mediastinum/Nodes: No specific abnormal lymph node enlargement  identified in the axillary region, hilum or mediastinum. Small thyroid gland. There is a patulous esophagus with luminal fluid. Subtle wall thickening distally. No pneumomediastinum. With the patient's history if there is concern of mucosal injury to the esophagus a limited esophagram could be considered as clinically appropriate. Lungs/Pleura: Calcified left upper lobe lung nodule on series 6, image 47 consistent with old granulomatous disease. No pneumothorax or effusion. There is some subtle patchy lung base areas of ground-glass as well as bandlike areas of opacity. Atelectasis and scarring are favored. More confluence area along the middle lobe with volume loss. Scattered breathing motion. Musculoskeletal: Scattered degenerative changes of the spine with Schmorl's node deformities. There is trace retrolisthesis of T12 on L1 with posterior osteophytes. Review of the MIP images confirms the above findings. CTA ABDOMEN AND PELVIS FINDINGS VASCULAR Aorta: Mild calcified atherosclerotic plaque along the abdominal aorta. No dissection or aneurysm formation. Celiac: Slight calcified plaque at the origin. No significant stenosis. Standard branching pattern. SMA: Minimal calcified plaque. Renals: 2 bilateral renal arteries. Small accessory vessels extending towards the upper pole bilaterally. IMA: Patent without evidence of aneurysm, dissection, vasculitis or significant stenosis. Inflow: Mild atherosclerotic calcified plaque along the iliac vessels particularly along the common iliac and proximal internal iliac vessels. No significant stenosis. Veins: No obvious venous abnormality within the limitations of this arterial phase study. Review of the MIP images confirms the above findings. NON-VASCULAR Hepatobiliary: Nodular contours of the liver. Liver is also small. Please correlate with the history of chronic liver disease. Previous cholecystectomy. No obvious mass with the limits of the early phase of the contrast  bolus for arterial evaluation. Pancreas: Unremarkable. No pancreatic ductal dilatation or surrounding inflammatory changes. Spleen: Spleen measures 13 cm in cephalocaudal length. Adrenals/Urinary Tract: Adrenal glands are preserved. Mild bilateral renal atrophy. No enhancing mass or collecting system dilatation. Preserved contours of the urinary bladder. Stomach/Bowel: On this non oral contrast exam, the stomach is nondilated. Overall small bowel is nondilated. The proximal duodenal has some luminal fluid. Large bowel has a normal course and caliber with scattered stool. Sigmoid colon diverticula  identified. Normal appendix. Lymphatic: No specific abnormal lymph node enlargement identified in the abdomen and pelvis. Reproductive: Uterus and bilateral adnexa are unremarkable. Other: Anasarca.  Mild ascites. Musculoskeletal: Mild degenerative changes of the spine and pelvis. Trace anterolisthesis of L5 on S1. Review of the MIP images confirms the above findings. IMPRESSION: Mild partially calcified atherosclerotic plaque. No dissection or aneurysm formation. Patulous esophagus with some luminal fluid. Small amount of high density seen in the area of the distal esophagus on the precontrast dataset. Possible debris. No pneumomediastinum. If there is further concern of mucosal abnormality to the esophagus, limited esophagram could be considered. Evidence of chronic liver disease with nodular small liver. Trace ascites and mild splenomegaly. Evaluation intrinsic liver lesion is limited with contrast bolus in the arterial phase. Electronically Signed   By: Karen Kays M.D.   On: 05/20/2023 13:46   DG Chest Port 1 View  Result Date: 05/20/2023 CLINICAL DATA:  Chest pain, shortness of breath EXAM: PORTABLE CHEST 1 VIEW COMPARISON:  01/08/2019 FINDINGS: Transverse diameter of heart is increased. There are no signs of pulmonary edema or focal pulmonary consolidation. Small linear densities are seen in left lower lung  field. There is no pleural effusion or pneumothorax. IMPRESSION: Cardiomegaly. There are no signs of pulmonary edema or focal pulmonary consolidation. Small linear densities in left lower lung field may suggest scarring or subsegmental atelectasis. Electronically Signed   By: Ernie Avena M.D.   On: 05/20/2023 13:31    Procedures Procedures    Medications Ordered in ED Medications  morphine (PF) 2 MG/ML injection 2 mg (2 mg Intravenous Given 05/20/23 1132)  ondansetron (ZOFRAN) injection 4 mg (4 mg Intravenous Given 05/20/23 1132)  alum & mag hydroxide-simeth (MAALOX/MYLANTA) 200-200-20 MG/5ML suspension 30 mL (30 mLs Oral Given 05/20/23 1155)  pantoprazole (PROTONIX) injection 40 mg (40 mg Intravenous Given 05/20/23 1155)  iohexol (OMNIPAQUE) 350 MG/ML injection 80 mL (80 mLs Intravenous Contrast Given 05/20/23 1244)    ED Course/ Medical Decision Making/ A&P                             Medical Decision Making Problems Addressed: Precordial chest pain: self-limited or minor problem S/P endoscopy: acute illness or injury with systemic symptoms  Amount and/or Complexity of Data Reviewed External Data Reviewed: notes. Labs: ordered. Decision-making details documented in ED Course. Radiology: ordered and independent interpretation performed. Decision-making details documented in ED Course. ECG/medicine tests: ordered and independent interpretation performed. Decision-making details documented in ED Course. Discussion of management or test interpretation with external provider(s): GI/DR Elnoria Howard  Risk OTC drugs. Prescription drug management. Parenteral controlled substances. Decision regarding hospitalization.   Iv ns. Continuous pulse ox and cardiac monitoring. Labs ordered/sent. Imaging ordered.   Differential diagnosis includes pain due to banding of varices, esophageal injury, dissection, etc. Dispo decision including potential need for admission considered - will get labs and  imaging and reassess.   Reviewed nursing notes and prior charts for additional history. External reports reviewed.   Morphine iv. Zofran iv. Protonix, maalox.   Cardiac monitor: sinus rhythm, rate 60.  Labs reviewed/interpreted by me - initial trop normal.   Xrays reviewed/interpreted by me - no pna. Atelectasis.   CT reviewed/interpreted by me - no dissection/acute process.  Additional labs reviewed/interpreted by me - delta trop normal and not significantly increased, felt not c/w acs.   Gi/Dr Elnoria Howard consulted as had done pt's EGD earlier today - discussed pt, indicates pain  likely due to banding of multiple varices, indicates procedures went well, no dilation or other intervention done and everything went very normally - feels patient can be d/c with outpatient f/u.  On recheck, pts earlier symptoms resolved. No chest pain or abd pain. Abd soft nt.            Final Clinical Impression(s) / ED Diagnoses Final diagnoses:  None    Rx / DC Orders ED Discharge Orders     None         Cathren Laine, MD 05/20/23 1434

## 2023-05-20 NOTE — Op Note (Signed)
[facility] Patient Name: Terri Turner Procedure Date: 05/20/2023 MRN: [id number] Attending MD: Jeani Hawking , MD, [id number] Date of Birth: 1946-04-10 CSN: [id number] Age: 77 Admit Type: Outpatient Procedure:                Upper GI endoscopy Indications:              Surveillance procedure Providers:                Jeani Hawking, MD, Fransisca Connors, Salley Scarlet, Technician, Albertina Senegal. Alday CRNA, CRNA Referring MD:              Medicines:                Propofol per Anesthesia Complications:            No immediate complications. Estimated Blood Loss:     Estimated blood loss: none. Procedure:                Pre-Anesthesia Assessment:                           - Prior to the procedure, a History and Physical                            was performed, and patient medications and                            allergies were reviewed. The patient's tolerance of                            previous anesthesia was also reviewed. The risks                            and benefits of the procedure and the sedation                            options and risks were discussed with the patient.                            All questions were answered, and informed consent                            was obtained. Prior Anticoagulants: The patient has                            taken no anticoagulant or antiplatelet agents. ASA                            Grade Assessment: III - A patient with severe                            systemic disease. After reviewing the risks and  benefits, the patient was deemed in satisfactory                            condition to undergo the procedure.                           - Sedation was administered by an anesthesia                            professional. Deep sedation was attained.                           After obtaining informed consent, the endoscope was                             passed under direct vision. Throughout the                            procedure, the patient's blood pressure, pulse, and                            oxygen saturations were monitored continuously. The                            [id number]-[id number] ([id number]) Olympus endoscope was introduced                            through the mouth, and advanced to the second part                            of duodenum. The upper GI endoscopy was                            accomplished without difficulty. The patient                            tolerated the procedure well. Scope In: Scope Out: Findings:      Large (> 5 mm) varices were found in the lower third of the esophagus.       They were 5 mm in largest diameter. Six bands were successfully placed       with complete eradication, resulting in deflation of varices. There was       no bleeding at the end of the procedure.      Mild portal hypertensive gastropathy was found in the entire examined       stomach.      The examined duodenum was normal. Impression:               - Large (> 5 mm) esophageal varices. Completely                            eradicated. Banded.                           - Portal hypertensive gastropathy.                           -  Normal examined duodenum.                           - No specimens collected. Moderate Sedation:      Not Applicable - Patient had care per Anesthesia. Recommendation:           - Patient has a contact number available for                            emergencies. The signs and symptoms of potential                            delayed complications were discussed with the                            patient. Return to normal activities tomorrow.                            Written discharge instructions were provided to the                            patient.                           - Resume previous diet.                           - Continue present medications.                           - Repeat upper  endoscopy in 4 weeks for retreatment. Procedure Code(s):        --- Professional ---                           [phone], Esophagogastroduodenoscopy, flexible,                            transoral; with band ligation of esophageal/gastric                            varices Diagnosis Code(s):        --- Professional ---                           I85.00, Esophageal varices without bleeding                           K76.6, Portal hypertension                           K31.89, Other diseases of stomach and duodenum CPT copyright 2022 American [employer] Association. All rights reserved. The codes documented in this report are preliminary and upon coder review may  be revised to meet current compliance requirements. Jeani Hawking, MD Jeani Hawking, MD 05/20/2023 8:11:53 AM This report has been signed electronically. Number of Addenda: 0

## 2023-05-20 NOTE — H&P (Signed)
Terri Turner HPI: This 77 year old Hispanic female presents to the office to schedule her surveillance EGD. Her last EGD was done on 09/17/2021 which revealed small (<5 mm) esophageal varices along with portal hypertensive gastropathy; the duodenum was otherwise normal. She has a history of fatty liver/NASH cirrhosis which was diagnosed in 2019. She takes Famotidine 40 mg once a day for acid reflux with good control. She has had a change in her bowel habits. She has being having fecal urgency and 'small pellets like BM's" that started a few months ago. At her baseline, she has 1 BM per day. Early last month she had 1 episode of bright red blood on the toilet tissue. She has good appetite. She has lost 12 pounds over the last 2 years she claims the weight loss has been unintentional. She denies having any complaints of abdominal pain, nausea, vomiting, dysphagia or odynophagia. She denies having a family history of colon cancer, celiac sprue or IBD. She had a normal colonoscopy done on 05/23/2015.  Past Medical History:  Diagnosis Date   Headache    Heart murmur    not treated..   Hypertension    TIA (transient ischemic attack)    2017    Past Surgical History:  Procedure Laterality Date   BREAST CYST ASPIRATION Right    CHOLECYSTECTOMY     ESOPHAGOGASTRODUODENOSCOPY (EGD) WITH PROPOFOL N/A 04/17/2021   Procedure: ESOPHAGOGASTRODUODENOSCOPY (EGD) WITH PROPOFOL;  Surgeon: Terri Hawking, MD;  Location: [facility];  Service: Endoscopy;  Laterality: N/A;   HYSTEROSCOPY WITH D & C N/A 12/29/2015   Procedure: DILATATION AND CURETTAGE /HYSTEROSCOPY;  Surgeon: Terri Hidalgo, DO;  Location: [facility];  Service: Gynecology;  Laterality: N/A;   URINARY SURGERY     mesh    WRIST SURGERY     left thumb (pin)    Family History  Problem Relation Age of Onset   Hypertension Mother    Stroke Father    Prostate cancer Brother    Hypertension Sister    High blood pressure Sister    Alzheimer's disease  Sister     Social History:  reports that she has never smoked. She has never used smokeless tobacco. She reports that she does not drink alcohol and does not use drugs.  Allergies: No Known Allergies  Medications: Scheduled: Continuous:  sodium chloride     lactated ringers 1,000 mL (05/20/23 0711)    No results found for this or any previous visit (from the past 24 hour(s)).   No results found.  ROS:  As stated above in the HPI otherwise negative.  Blood pressure (!) 149/60, pulse (!) 59, temperature 97.6 F (36.4 C), temperature source Tympanic, resp. rate 13, height 5\' 1"  (1.549 m), weight 61.2 kg, SpO2 100 %.    PE: Gen: NAD, Alert and Oriented HEENT:  Terri Turner/AT, EOMI Neck: Supple, no LAD Lungs: CTA Bilaterally CV: RRR without M/G/R ABD: Soft, NTND, +BS Ext: No C/C/E  Assessment/Plan: 1) Esophageal varices. 2) Hematochezia.  Plan: 1) EGD/colonoscopy.  Terri Turner D 05/20/2023, 7:22 AM

## 2023-05-22 ENCOUNTER — Encounter (HOSPITAL_COMMUNITY): Payer: Self-pay | Admitting: Gastroenterology

## 2023-05-23 LAB — SURGICAL PATHOLOGY

## 2023-05-24 ENCOUNTER — Other Ambulatory Visit: Payer: Self-pay | Admitting: Gastroenterology

## 2023-06-21 ENCOUNTER — Encounter (HOSPITAL_COMMUNITY): Payer: Self-pay | Admitting: Gastroenterology

## 2023-06-21 NOTE — Progress Notes (Signed)
Attempted to obtain medical history via telephone, unable to reach at this time. HIPAA compliant voicemail message left requesting return call to pre surgical testing department. 

## 2023-06-27 ENCOUNTER — Other Ambulatory Visit: Payer: Self-pay | Admitting: Family Medicine

## 2023-06-27 DIAGNOSIS — Z1231 Encounter for screening mammogram for malignant neoplasm of breast: Secondary | ICD-10-CM

## 2023-06-30 ENCOUNTER — Ambulatory Visit
Admission: RE | Admit: 2023-06-30 | Discharge: 2023-06-30 | Disposition: A | Discharge status: Home / Self Care | Payer: Medicare HMO | Source: Ambulatory Visit | Attending: Family Medicine | Admitting: Family Medicine

## 2023-06-30 DIAGNOSIS — Z1231 Encounter for screening mammogram for malignant neoplasm of breast: Secondary | ICD-10-CM

## 2023-07-01 ENCOUNTER — Other Ambulatory Visit: Payer: Self-pay

## 2023-07-01 ENCOUNTER — Ambulatory Visit (HOSPITAL_COMMUNITY): Payer: Medicare HMO | Admitting: Certified Registered Nurse Anesthetist

## 2023-07-01 ENCOUNTER — Encounter (HOSPITAL_COMMUNITY): Payer: Self-pay | Admitting: Gastroenterology

## 2023-07-01 ENCOUNTER — Ambulatory Visit (HOSPITAL_BASED_OUTPATIENT_CLINIC_OR_DEPARTMENT_OTHER): Payer: Medicare HMO | Admitting: Certified Registered Nurse Anesthetist

## 2023-07-01 ENCOUNTER — Ambulatory Visit (HOSPITAL_COMMUNITY)
Admission: RE | Admit: 2023-07-01 | Discharge: 2023-07-01 | Disposition: A | Discharge status: Home / Self Care | Payer: Medicare HMO | Attending: Gastroenterology | Admitting: Gastroenterology

## 2023-07-01 ENCOUNTER — Encounter (HOSPITAL_COMMUNITY)
Admission: RE | Disposition: A | Discharge status: Home / Self Care | Payer: Self-pay | Source: Home / Self Care | Attending: Gastroenterology

## 2023-07-01 DIAGNOSIS — I8500 Esophageal varices without bleeding: Secondary | ICD-10-CM | POA: Diagnosis not present

## 2023-07-01 DIAGNOSIS — I10 Essential (primary) hypertension: Secondary | ICD-10-CM | POA: Insufficient documentation

## 2023-07-01 DIAGNOSIS — K3189 Other diseases of stomach and duodenum: Secondary | ICD-10-CM | POA: Insufficient documentation

## 2023-07-01 DIAGNOSIS — K766 Portal hypertension: Secondary | ICD-10-CM | POA: Diagnosis not present

## 2023-07-01 DIAGNOSIS — I8510 Secondary esophageal varices without bleeding: Secondary | ICD-10-CM | POA: Insufficient documentation

## 2023-07-01 HISTORY — PX: ESOPHAGOGASTRODUODENOSCOPY (EGD) WITH PROPOFOL: SHX5813

## 2023-07-01 HISTORY — PX: ESOPHAGEAL BANDING: SHX5518

## 2023-07-01 SURGERY — ESOPHAGOGASTRODUODENOSCOPY (EGD) WITH PROPOFOL
Anesthesia: Monitor Anesthesia Care

## 2023-07-01 MED ORDER — PROPOFOL 500 MG/50ML IV EMUL
INTRAVENOUS | Status: DC | PRN
  Administered 2023-07-01: 60 mg via INTRAVENOUS
  Administered 2023-07-01: 125 ug/kg/min via INTRAVENOUS

## 2023-07-01 MED ORDER — LACTATED RINGERS IV SOLN: INTRAVENOUS | Status: DC

## 2023-07-01 MED ORDER — EPHEDRINE SULFATE-NACL 50-0.9 MG/10ML-% IV SOSY
PREFILLED_SYRINGE | INTRAVENOUS | Status: DC | PRN
  Administered 2023-07-01: 10 mg via INTRAVENOUS

## 2023-07-01 MED ORDER — ONDANSETRON HCL 4 MG/2ML IJ SOLN: 4.0000 mg | Freq: Four times a day (QID) | INTRAMUSCULAR | Status: DC | PRN

## 2023-07-01 MED ORDER — SODIUM CHLORIDE 0.9 % IV SOLN: INTRAVENOUS | Status: DC

## 2023-07-01 MED ORDER — OXYCODONE HCL 5 MG/5ML PO SOLN: 5.0000 mg | Freq: Once | ORAL | Status: DC | PRN

## 2023-07-01 MED ORDER — FENTANYL CITRATE (PF) 100 MCG/2ML IJ SOLN: 25.0000 ug | INTRAMUSCULAR | Status: DC | PRN

## 2023-07-01 MED ORDER — OXYCODONE HCL 5 MG PO TABS: 5.0000 mg | ORAL_TABLET | Freq: Once | ORAL | Status: DC | PRN

## 2023-07-01 SURGICAL SUPPLY — 15 items

## 2023-07-01 NOTE — Discharge Instructions (Signed)

## 2023-07-01 NOTE — Anesthesia Preprocedure Evaluation (Signed)
Anesthesia Evaluation  Patient identified by MRN, date of birth, ID band Patient awake    Reviewed: Allergy & Precautions, H&P , NPO status , Patient's Chart, lab work & pertinent test results  Airway Mallampati: II   Neck ROM: full    Dental   Pulmonary neg pulmonary ROS   breath sounds clear to auscultation       Cardiovascular hypertension,  Rhythm:regular Rate:Normal     Neuro/Psych  Headaches TIA   GI/Hepatic Esophageal varices   Endo/Other    Renal/GU      Musculoskeletal   Abdominal   Peds  Hematology   Anesthesia Other Findings   Reproductive/Obstetrics                             Anesthesia Physical Anesthesia Plan  ASA: 2  Anesthesia Plan: MAC   Post-op Pain Management:    Induction: Intravenous  PONV Risk Score and Plan: 2 and Propofol infusion and Treatment may vary due to age or medical condition  Airway Management Planned: Nasal Cannula  Additional Equipment:   Intra-op Plan:   Post-operative Plan:   Informed Consent: I have reviewed the patients History and Physical, chart, labs and discussed the procedure including the risks, benefits and alternatives for the proposed anesthesia with the patient or authorized representative who has indicated his/her understanding and acceptance.     Dental advisory given  Plan Discussed with: CRNA, Anesthesiologist and Surgeon  Anesthesia Plan Comments:        Anesthesia Quick Evaluation

## 2023-07-01 NOTE — Transfer of Care (Signed)
Immediate Anesthesia Transfer of Care Note  Patient: Terri Turner  Procedure(s) Performed: Procedure(s): ESOPHAGOGASTRODUODENOSCOPY (EGD) WITH PROPOFOL (N/A) ESOPHAGEAL BANDING (N/A)  Patient Location: PACU and Endoscopy Unit  Anesthesia Type:MAC  Level of Consciousness: awake, alert  and oriented  Airway & Oxygen Therapy: Patient Spontanous Breathing and Patient connected to nasal cannula oxygen  Post-op Assessment: Report given to RN and Post -op Vital signs reviewed and stable  Post vital signs: Reviewed and stable  Last Vitals:  Vitals:   07/01/23 0834  BP: 121/62  Pulse: (!) 55  Resp: 17  Temp: 36.4 C  SpO2: 98%    Complications: No apparent anesthesia complications

## 2023-07-01 NOTE — H&P (Signed)
Terri Turner HPI: The patient is here for repeat treatment of her esophageal varices.  Past Medical History:  Diagnosis Date   Headache    Heart murmur    not treated..   Hypertension    TIA (transient ischemic attack)    2017    Past Surgical History:  Procedure Laterality Date   BREAST CYST ASPIRATION Right    CHOLECYSTECTOMY     COLONOSCOPY WITH PROPOFOL N/A 05/20/2023   Procedure: COLONOSCOPY WITH PROPOFOL;  Surgeon: Terri Hawking, MD;  Location: [facility];  Service: Gastroenterology;  Laterality: N/A;   ESOPHAGEAL BANDING  05/20/2023   Procedure: ESOPHAGEAL BANDING;  Surgeon: Terri Hawking, MD;  Location: [facility];  Service: Gastroenterology;;   ESOPHAGOGASTRODUODENOSCOPY (EGD) WITH PROPOFOL N/A 04/17/2021   Procedure: ESOPHAGOGASTRODUODENOSCOPY (EGD) WITH PROPOFOL;  Surgeon: Terri Hawking, MD;  Location: [facility];  Service: Endoscopy;  Laterality: N/A;   ESOPHAGOGASTRODUODENOSCOPY (EGD) WITH PROPOFOL N/A 05/20/2023   Procedure: ESOPHAGOGASTRODUODENOSCOPY (EGD) WITH PROPOFOL;  Surgeon: Terri Hawking, MD;  Location: [facility];  Service: Gastroenterology;  Laterality: N/A;   HYSTEROSCOPY WITH D & C N/A 12/29/2015   Procedure: DILATATION AND CURETTAGE /HYSTEROSCOPY;  Surgeon: Terri Hidalgo, DO;  Location: [facility];  Service: Gynecology;  Laterality: N/A;   POLYPECTOMY  05/20/2023   Procedure: POLYPECTOMY;  Surgeon: Terri Hawking, MD;  Location: [facility];  Service: Gastroenterology;;   URINARY SURGERY     mesh    WRIST SURGERY     left thumb (pin)    Family History  Problem Relation Age of Onset   Hypertension Mother    Stroke Father    Prostate cancer Brother    Hypertension Sister    High blood pressure Sister    Alzheimer's disease Sister     Social History:  reports that she has never smoked. She has never used smokeless tobacco. She reports that she does not drink alcohol and does not use drugs.  Allergies:  Allergies  Allergen Reactions    Nortriptyline     Hallucinations     Medications: Scheduled: Continuous:  sodium chloride     lactated ringers 10 mL/hr at 07/01/23 0840    No results found for this or any previous visit (from the past 24 hour(s)).   No results found.  ROS:  As stated above in the HPI otherwise negative.  Blood pressure 121/62, pulse (!) 55, temperature 97.6 F (36.4 C), temperature source Temporal, resp. rate 17, height 5\' 1"  (1.549 m), weight 57.6 kg, SpO2 98%.    PE: Gen: NAD, Alert and Oriented HEENT:  [place]/AT, EOMI Neck: Supple, no LAD Lungs: CTA Bilaterally CV: RRR without M/G/R ABD: Soft, NTND, +BS Ext: No C/C/E  Assessment/Plan: 1) Esophageal varices - EGD with banding.  [name] D 07/01/2023, 8:58 AM

## 2023-07-01 NOTE — Op Note (Signed)
[facility] Patient Name: Terri Turner Procedure Date: 07/01/2023 MRN: [id number] Attending MD: Jeani Hawking , MD, [id number] Date of Birth: 1946-01-17 CSN: [id number] Age: 77 Admit Type: Outpatient Procedure:                Upper GI endoscopy Indications:              Therapeutic procedure Providers:                Jeani Hawking, MD, Martha Clan, RN, Kandice Robinsons, Technician Referring MD:              Medicines:                Propofol per Anesthesia Complications:            No immediate complications. Estimated Blood Loss:     Estimated blood loss: none. Procedure:                Pre-Anesthesia Assessment:                           - Prior to the procedure, a History and Physical                            was performed, and patient medications and                            allergies were reviewed. The patient's tolerance of                            previous anesthesia was also reviewed. The risks                            and benefits of the procedure and the sedation                            options and risks were discussed with the patient.                            All questions were answered, and informed consent                            was obtained. Prior Anticoagulants: The patient has                            taken no anticoagulant or antiplatelet agents. ASA                            Grade Assessment: III - A patient with severe                            systemic disease. After reviewing the risks and  benefits, the patient was deemed in satisfactory                            condition to undergo the procedure.                           - Sedation was administered by an anesthesia                            professional. Deep sedation was attained.                           After obtaining informed consent, the endoscope was                            passed under direct vision.  Throughout the                            procedure, the patient's blood pressure, pulse, and                            oxygen saturations were monitored continuously. The                            GIF-H190 ([id number]) Olympus endoscope was introduced                            through the mouth, and advanced to the second part                            of duodenum. The upper GI endoscopy was                            accomplished without difficulty. The patient                            tolerated the procedure well. Scope In: Scope Out: Findings:      Large (> 5 mm) varices were found in the lower third of the esophagus.       Five bands were successfully placed with complete eradication, resulting       in deflation of varices. There was no bleeding at the end of the       procedure.      Moderate portal hypertensive gastropathy was found in the gastric fundus       and in the gastric body.      The examined duodenum was normal. Impression:               - Large (> 5 mm) esophageal varices. Completely                            eradicated. Banded.                           - Portal hypertensive gastropathy.                           -  Normal examined duodenum.                           - No specimens collected. Moderate Sedation:      Not Applicable - Patient had care per Anesthesia. Recommendation:           - Patient has a contact number available for                            emergencies. The signs and symptoms of potential                            delayed complications were discussed with the                            patient. Return to normal activities tomorrow.                            Written discharge instructions were provided to the                            patient.                           - Resume previous diet.                           - Continue present medications.                           - Repeat upper endoscopy in 4 weeks for retreatment. Procedure  Code(s):        --- Professional ---                           [phone], Esophagogastroduodenoscopy, flexible,                            transoral; with band ligation of esophageal/gastric                            varices Diagnosis Code(s):        --- Professional ---                           I85.00, Esophageal varices without bleeding                           K76.6, Portal hypertension                           K31.89, Other diseases of stomach and duodenum CPT copyright 2022 American [employer] Association. All rights reserved. The codes documented in this report are preliminary and upon coder review may  be revised to meet current compliance requirements. Jeani Hawking, MD Jeani Hawking, MD 07/01/2023 9:43:32 AM This report has been signed electronically. Number of Addenda: 0

## 2023-07-04 ENCOUNTER — Encounter (HOSPITAL_COMMUNITY): Payer: Self-pay | Admitting: Gastroenterology

## 2023-07-04 NOTE — Anesthesia Postprocedure Evaluation (Signed)
Anesthesia Post Note  Patient: Terri Turner  Procedure(s) Performed: ESOPHAGOGASTRODUODENOSCOPY (EGD) WITH PROPOFOL ESOPHAGEAL BANDING     Patient location during evaluation: Endoscopy Anesthesia Type: MAC Level of consciousness: awake and alert Pain management: pain level controlled Vital Signs Assessment: post-procedure vital signs reviewed and stable Respiratory status: spontaneous breathing, nonlabored ventilation, respiratory function stable and patient connected to nasal cannula oxygen Cardiovascular status: stable and blood pressure returned to baseline Postop Assessment: no apparent nausea or vomiting Anesthetic complications: no   No notable events documented.  Last Vitals:  Vitals:   07/01/23 0934 07/01/23 0944  BP: (!) 103/90 111/64  Pulse: 60 60  Resp: 19 17  Temp:    SpO2: 94% 98%    Last Pain:  Vitals:   07/01/23 0944  TempSrc:   PainSc: 0-No pain                 [name]

## 2023-08-11 ENCOUNTER — Other Ambulatory Visit: Payer: Self-pay | Admitting: Gastroenterology

## 2023-09-02 ENCOUNTER — Encounter (HOSPITAL_COMMUNITY): Payer: Self-pay | Admitting: Gastroenterology

## 2023-09-08 ENCOUNTER — Encounter (HOSPITAL_COMMUNITY): Payer: Self-pay | Admitting: Gastroenterology

## 2023-09-09 ENCOUNTER — Encounter (HOSPITAL_COMMUNITY): Payer: Self-pay | Admitting: Gastroenterology

## 2023-09-09 ENCOUNTER — Encounter (HOSPITAL_COMMUNITY)
Admission: RE | Disposition: A | Discharge status: Home / Self Care | Payer: Self-pay | Source: Home / Self Care | Attending: Gastroenterology

## 2023-09-09 ENCOUNTER — Ambulatory Visit (HOSPITAL_COMMUNITY): Payer: Medicare HMO | Admitting: Anesthesiology

## 2023-09-09 ENCOUNTER — Ambulatory Visit (HOSPITAL_COMMUNITY)
Admission: RE | Admit: 2023-09-09 | Discharge: 2023-09-09 | Disposition: A | Discharge status: Home / Self Care | Payer: Medicare HMO | Attending: Gastroenterology | Admitting: Gastroenterology

## 2023-09-09 DIAGNOSIS — I8510 Secondary esophageal varices without bleeding: Secondary | ICD-10-CM | POA: Diagnosis not present

## 2023-09-09 DIAGNOSIS — Z8673 Personal history of transient ischemic attack (TIA), and cerebral infarction without residual deficits: Secondary | ICD-10-CM | POA: Diagnosis not present

## 2023-09-09 DIAGNOSIS — K7581 Nonalcoholic steatohepatitis (NASH): Secondary | ICD-10-CM | POA: Diagnosis present

## 2023-09-09 DIAGNOSIS — K766 Portal hypertension: Secondary | ICD-10-CM | POA: Diagnosis not present

## 2023-09-09 DIAGNOSIS — I8500 Esophageal varices without bleeding: Secondary | ICD-10-CM | POA: Diagnosis not present

## 2023-09-09 DIAGNOSIS — K3189 Other diseases of stomach and duodenum: Secondary | ICD-10-CM | POA: Insufficient documentation

## 2023-09-09 DIAGNOSIS — R519 Headache, unspecified: Secondary | ICD-10-CM | POA: Diagnosis not present

## 2023-09-09 DIAGNOSIS — I10 Essential (primary) hypertension: Secondary | ICD-10-CM | POA: Insufficient documentation

## 2023-09-09 HISTORY — PX: ESOPHAGOGASTRODUODENOSCOPY (EGD) WITH PROPOFOL: SHX5813

## 2023-09-09 HISTORY — PX: ESOPHAGEAL BANDING: SHX5518

## 2023-09-09 LAB — GLUCOSE, CAPILLARY: Glucose-Capillary: 73 mg/dL (ref 70–99)

## 2023-09-09 SURGERY — ESOPHAGOGASTRODUODENOSCOPY (EGD) WITH PROPOFOL
Anesthesia: Monitor Anesthesia Care

## 2023-09-09 MED ORDER — LACTATED RINGERS IV SOLN
INTRAVENOUS | Status: AC | PRN
Start: 2023-09-09 — End: 2023-09-09
  Administered 2023-09-09: 1000 mL via INTRAVENOUS

## 2023-09-09 MED ORDER — LIDOCAINE HCL (CARDIAC) PF 100 MG/5ML IV SOSY
PREFILLED_SYRINGE | INTRAVENOUS | Status: DC | PRN
  Administered 2023-09-09: 60 mg via INTRAVENOUS

## 2023-09-09 MED ORDER — PROPOFOL 500 MG/50ML IV EMUL
INTRAVENOUS | Status: DC | PRN
  Administered 2023-09-09: 135 ug/kg/min via INTRAVENOUS

## 2023-09-09 SURGICAL SUPPLY — 15 items

## 2023-09-09 NOTE — Op Note (Signed)
[facility] Patient Name: Terri Turner Procedure Date: 09/09/2023 MRN: [id number] Attending MD: Jeani Hawking , MD, [id number] Date of Birth: 07-27-1946 CSN: [id number] Age: 77 Admit Type: Outpatient Procedure:                Upper GI endoscopy Indications:              Therapeutic procedure Providers:                Jeani Hawking, MD, Stephens Shire RN, RN, Jacquelyn                            "Jaci" Clelia Croft, RN, Kandice Robinsons, Technician Referring MD:              Medicines:                Propofol per Anesthesia Complications:            No immediate complications. Estimated Blood Loss:     Estimated blood loss: none. Procedure:                Pre-Anesthesia Assessment:                           - Prior to the procedure, a History and Physical                            was performed, and patient medications and                            allergies were reviewed. The patient's tolerance of                            previous anesthesia was also reviewed. The risks                            and benefits of the procedure and the sedation                            options and risks were discussed with the patient.                            All questions were answered, and informed consent                            was obtained. Prior Anticoagulants: The patient has                            taken no anticoagulant or antiplatelet agents. ASA                            Grade Assessment: III - A patient with severe                            systemic disease. After reviewing the risks and  benefits, the patient was deemed in satisfactory                            condition to undergo the procedure.                           - Sedation was administered by an anesthesia                            professional. Deep sedation was attained.                           After obtaining informed consent, the endoscope was                             passed under direct vision. Throughout the                            procedure, the patient's blood pressure, pulse, and                            oxygen saturations were monitored continuously. The                            GIF-H190 ([id number]) Olympus endoscope was introduced                            through the mouth, and advanced to the second part                            of duodenum. The upper GI endoscopy was                            accomplished without difficulty. The patient                            tolerated the procedure well. Scope In: Scope Out: Findings:      Large (> 5 mm) varices were found in the lower third of the esophagus.       Three bands were successfully placed with complete eradication,       resulting in deflation of varices. There was no bleeding at the end of       the procedure.      Mild portal hypertensive gastropathy was found in the gastric fundus and       in the gastric body.      The examined duodenum was normal. Impression:               - Large (> 5 mm) esophageal varices. Completely                            eradicated. Banded.                           - Portal hypertensive gastropathy.                           -  Normal examined duodenum.                           - No specimens collected. Moderate Sedation:      Not Applicable - Patient had care per Anesthesia. Recommendation:           - Patient has a contact number available for                            emergencies. The signs and symptoms of potential                            delayed complications were discussed with the                            patient. Return to normal activities tomorrow.                            Written discharge instructions were provided to the                            patient.                           - Resume previous diet.                           - Continue present medications.                           - Repeat upper endoscopy in 6 years  for retreatment.                           - Start beta blocker. Procedure Code(s):        --- Professional ---                           [phone], Esophagogastroduodenoscopy, flexible,                            transoral; with band ligation of esophageal/gastric                            varices Diagnosis Code(s):        --- Professional ---                           I85.00, Esophageal varices without bleeding                           K76.6, Portal hypertension                           K31.89, Other diseases of stomach and duodenum CPT copyright 2022 American Medical Association. All rights reserved. The codes documented in this report are preliminary and upon coder review may  be revised to meet current compliance requirements. Jeani Hawking, MD Jeani Hawking, MD 09/09/2023 11:11:50  AM This report has been signed electronically. Number of Addenda: 0

## 2023-09-09 NOTE — Discharge Instructions (Signed)
YOU HAD AN ENDOSCOPIC PROCEDURE TODAY: Refer to the procedure report and other information in the discharge instructions given to you for any specific questions about what was found during the examination. If this information does not answer your questions, please call [facility] at [phone] to clarify.   YOU SHOULD EXPECT: Some feelings of bloating in the abdomen. Passage of more gas than usual. Walking can help get rid of the air that was put into your GI tract during the procedure and reduce the bloating. If you had a lower endoscopy (such as a colonoscopy or flexible sigmoidoscopy) you may notice spotting of blood in your stool or on the toilet paper. Some abdominal soreness may be present for a day or two, also.  DIET: Your first meal following the procedure should be a light meal and then it is ok to progress to your normal diet. A half-sandwich or bowl of soup is an example of a good first meal. Heavy or fried foods are harder to digest and may make you feel nauseous or bloated. Drink plenty of fluids but you should avoid alcoholic beverages for 24 hours. If you had an esophageal dilation, please see attached information for diet.   ACTIVITY: Your care partner should take you home directly after the procedure. You should plan to take it easy, moving slowly for the rest of the day. You can resume normal activity the day after the procedure however YOU SHOULD NOT DRIVE, use power tools, machinery or perform tasks that involve climbing or major physical exertion for 24 hours (because of the sedation medicines used during the test).   SYMPTOMS TO REPORT IMMEDIATELY: A gastroenterologist can be reached at any hour. Please call [phone]  for any of the following symptoms:  Following lower endoscopy (colonoscopy, flexible sigmoidoscopy) Excessive amounts of blood in the stool  Significant tenderness, worsening of abdominal pains  Swelling of the abdomen that is new, acute  Fever of  100 or higher  Following upper endoscopy (EGD, EUS, ERCP, esophageal dilation) Vomiting of blood or coffee ground material  New, significant abdominal pain  New, significant chest pain or pain under the shoulder blades  Painful or persistently difficult swallowing  New shortness of breath  Black, tarry-looking or red, bloody stools  FOLLOW UP:  If any biopsies were taken you will be contacted by phone or by letter within the next 1-3 weeks. Call [phone]  if you have not heard about the biopsies in 3 weeks.  Please also call with any specific questions about appointments or follow up tests.

## 2023-09-09 NOTE — H&P (Signed)
Terri Turner HPI: This is a 77 year old female with MASH cirrhosis and history of large esophageal varices.  She is here for continued treatment of her varices.  Past Medical History:  Diagnosis Date   Headache    Heart murmur    not treated..   Hypertension    TIA (transient ischemic attack)    2017    Past Surgical History:  Procedure Laterality Date   BREAST CYST ASPIRATION Right    CHOLECYSTECTOMY     COLONOSCOPY WITH PROPOFOL N/A 05/20/2023   Procedure: COLONOSCOPY WITH PROPOFOL;  Surgeon: Jeani Hawking, MD;  Location: [facility];  Service: Gastroenterology;  Laterality: N/A;   ESOPHAGEAL BANDING  05/20/2023   Procedure: ESOPHAGEAL BANDING;  Surgeon: Jeani Hawking, MD;  Location: [facility];  Service: Gastroenterology;;   ESOPHAGEAL BANDING N/A 07/01/2023   Procedure: ESOPHAGEAL BANDING;  Surgeon: Jeani Hawking, MD;  Location: [facility];  Service: Gastroenterology;  Laterality: N/A;   ESOPHAGOGASTRODUODENOSCOPY (EGD) WITH PROPOFOL N/A 04/17/2021   Procedure: ESOPHAGOGASTRODUODENOSCOPY (EGD) WITH PROPOFOL;  Surgeon: Jeani Hawking, MD;  Location: [facility];  Service: Endoscopy;  Laterality: N/A;   ESOPHAGOGASTRODUODENOSCOPY (EGD) WITH PROPOFOL N/A 05/20/2023   Procedure: ESOPHAGOGASTRODUODENOSCOPY (EGD) WITH PROPOFOL;  Surgeon: Jeani Hawking, MD;  Location: [facility];  Service: Gastroenterology;  Laterality: N/A;   ESOPHAGOGASTRODUODENOSCOPY (EGD) WITH PROPOFOL N/A 07/01/2023   Procedure: ESOPHAGOGASTRODUODENOSCOPY (EGD) WITH PROPOFOL;  Surgeon: Jeani Hawking, MD;  Location: [facility];  Service: Gastroenterology;  Laterality: N/A;   HYSTEROSCOPY WITH D & C N/A 12/29/2015   Procedure: DILATATION AND CURETTAGE /HYSTEROSCOPY;  Surgeon: Myna Hidalgo, DO;  Location: [facility];  Service: Gynecology;  Laterality: N/A;   POLYPECTOMY  05/20/2023   Procedure: POLYPECTOMY;  Surgeon: Jeani Hawking, MD;  Location: [facility];  Service: Gastroenterology;;   URINARY SURGERY     mesh     WRIST SURGERY     left thumb (pin)    Family History  Problem Relation Age of Onset   Hypertension Mother    Stroke Father    Prostate cancer Brother    Hypertension Sister    High blood pressure Sister    Alzheimer's disease Sister     Social History:  reports that she has never smoked. She has never used smokeless tobacco. She reports that she does not drink alcohol and does not use drugs.  Allergies:  Allergies  Allergen Reactions   Nortriptyline     Hallucinations     Medications: Scheduled: Continuous:  lactated ringers 1,000 mL (09/09/23 1000)    Results for orders placed or performed during the hospital encounter of 09/09/23 (from the past 24 hour(s))  Glucose, capillary     Status: None   Collection Time: 09/09/23 10:09 AM  Result Value Ref Range   Glucose-Capillary 73 70 - 99 mg/dL     No results found.  ROS:  As stated above in the HPI otherwise negative.  Blood pressure (!) 167/70, pulse (!) 51, temperature (!) 97.4 F (36.3 C), temperature source Temporal, resp. rate 10, height 5' (1.524 m), weight 58.1 kg, SpO2 100%.    PE: Gen: NAD, Alert and Oriented HEENT:  /AT, EOMI Neck: Supple, no LAD Lungs: CTA Bilaterally CV: RRR without M/G/R ABD: Soft, NTND, +BS Ext: No C/C/E  Assessment/Plan: 1) Esophageal varices. 2) MASH cirrhosis.  Plan: 1) EGD with banding.  Faraz Ponciano D 09/09/2023, 10:12 AM

## 2023-09-09 NOTE — Anesthesia Postprocedure Evaluation (Signed)
Anesthesia Post Note  Patient: TIJAH ALMAS  Procedure(s) Performed: ESOPHAGOGASTRODUODENOSCOPY (EGD) WITH PROPOFOL ESOPHAGEAL BANDING     Patient location during evaluation: PACU Anesthesia Type: MAC Level of consciousness: awake and alert Pain management: pain level controlled Vital Signs Assessment: post-procedure vital signs reviewed and stable Respiratory status: spontaneous breathing, nonlabored ventilation, respiratory function stable and patient connected to nasal cannula oxygen Cardiovascular status: stable and blood pressure returned to baseline Postop Assessment: no apparent nausea or vomiting Anesthetic complications: no  No notable events documented.  Last Vitals:  Vitals:   09/09/23 1130 09/09/23 1140  BP: (!) 149/51 (!) 151/68  Pulse: (!) 52 (!) 47  Resp: 16 (!) 21  Temp:    SpO2: 97% 96%    Last Pain:  Vitals:   09/09/23 1140  TempSrc:   PainSc: 0-No pain                 Kennieth Rad

## 2023-09-09 NOTE — Anesthesia Preprocedure Evaluation (Signed)
Anesthesia Evaluation  Patient identified by MRN, date of birth, ID band Patient awake    Reviewed: Allergy & Precautions, H&P , NPO status , Patient's Chart, lab work & pertinent test results  Airway Mallampati: II   Neck ROM: full    Dental   Pulmonary neg pulmonary ROS   breath sounds clear to auscultation       Cardiovascular hypertension,  Rhythm:regular Rate:Normal     Neuro/Psych  Headaches TIA   GI/Hepatic Esophageal varices   Endo/Other    Renal/GU      Musculoskeletal   Abdominal   Peds  Hematology   Anesthesia Other Findings   Reproductive/Obstetrics                             Anesthesia Physical Anesthesia Plan  ASA: 2  Anesthesia Plan: MAC   Post-op Pain Management:    Induction: Intravenous  PONV Risk Score and Plan: 2 and Propofol infusion and Treatment may vary due to age or medical condition  Airway Management Planned: Nasal Cannula  Additional Equipment:   Intra-op Plan:   Post-operative Plan:   Informed Consent: I have reviewed the patients History and Physical, chart, labs and discussed the procedure including the risks, benefits and alternatives for the proposed anesthesia with the patient or authorized representative who has indicated his/her understanding and acceptance.     Dental advisory given  Plan Discussed with: CRNA, Anesthesiologist and Surgeon  Anesthesia Plan Comments:        Anesthesia Quick Evaluation

## 2023-09-09 NOTE — Transfer of Care (Signed)
Immediate Anesthesia Transfer of Care Note  Patient: Terri Turner  Procedure(s) Performed: Procedure(s): ESOPHAGOGASTRODUODENOSCOPY (EGD) WITH PROPOFOL (N/A) ESOPHAGEAL BANDING (N/A)  Patient Location: PACU  Anesthesia Type:MAC  Level of Consciousness:  sedated, patient cooperative and responds to stimulation  Airway & Oxygen Therapy:Patient Spontanous Breathing and Patient connected to face mask oxgen  Post-op Assessment:  Report given to PACU RN and Post -op Vital signs reviewed and stable  Post vital signs:  Reviewed and stable  Last Vitals:  Vitals:   09/09/23 0951  BP: (!) 167/70  Pulse: (!) 51  Resp: 10  Temp: (!) 36.3 C  SpO2: 100%    Complications: No apparent anesthesia complications

## 2023-09-11 ENCOUNTER — Encounter (HOSPITAL_COMMUNITY): Payer: Self-pay | Admitting: Gastroenterology

## 2023-09-12 NOTE — Progress Notes (Signed)
Spoke with Terri Turner via phone following up on EGD with esophageal varices banding on 9/20 with MD Elnoria Howard.  Terri Turner c/o a tight sensation in her chest, which she states is similar to prior esophogeal bandings. Advised Terri Turner that some tightness is normal after esophageal banding, but to call MD Hung's office immediately if symptoms worsen. Terri Turner expressed understanding.  Terri Turner stated she has questions regarding her procedure and would like to speak with MD Elnoria Howard or MD Loreta Ave. Advised Terri Turner to call MD's office to follow up on this. Terri Turner stated she would do so.  Secure message sent to MD [facility] detailing this conversation.  Eulas Post, RN 09/12/23 3:34 PM

## 2023-12-15 ENCOUNTER — Other Ambulatory Visit: Payer: Self-pay | Admitting: Gastroenterology

## 2023-12-27 ENCOUNTER — Other Ambulatory Visit (HOSPITAL_COMMUNITY): Payer: Self-pay | Admitting: Internal Medicine

## 2023-12-27 DIAGNOSIS — I8500 Esophageal varices without bleeding: Secondary | ICD-10-CM

## 2023-12-27 DIAGNOSIS — K7460 Unspecified cirrhosis of liver: Secondary | ICD-10-CM

## 2024-01-02 ENCOUNTER — Ambulatory Visit (HOSPITAL_COMMUNITY)
Admission: RE | Admit: 2024-01-02 | Discharge: 2024-01-02 | Disposition: A | Discharge status: Home / Self Care | Payer: Medicare HMO | Source: Ambulatory Visit | Attending: Internal Medicine | Admitting: Internal Medicine

## 2024-01-02 DIAGNOSIS — I8500 Esophageal varices without bleeding: Secondary | ICD-10-CM | POA: Diagnosis present

## 2024-01-02 DIAGNOSIS — K7460 Unspecified cirrhosis of liver: Secondary | ICD-10-CM | POA: Insufficient documentation

## 2024-01-27 ENCOUNTER — Other Ambulatory Visit (HOSPITAL_COMMUNITY): Payer: Self-pay | Admitting: Internal Medicine

## 2024-01-27 DIAGNOSIS — K7460 Unspecified cirrhosis of liver: Secondary | ICD-10-CM

## 2024-01-30 ENCOUNTER — Ambulatory Visit (HOSPITAL_COMMUNITY)
Admission: RE | Admit: 2024-01-30 | Discharge: 2024-01-30 | Disposition: A | Discharge status: Home / Self Care | Payer: Medicare HMO | Source: Ambulatory Visit | Attending: Internal Medicine | Admitting: Internal Medicine

## 2024-01-30 ENCOUNTER — Other Ambulatory Visit (HOSPITAL_COMMUNITY): Payer: Self-pay | Admitting: Internal Medicine

## 2024-01-30 DIAGNOSIS — K7460 Unspecified cirrhosis of liver: Secondary | ICD-10-CM | POA: Diagnosis present

## 2024-01-30 DIAGNOSIS — R188 Other ascites: Secondary | ICD-10-CM | POA: Insufficient documentation

## 2024-01-30 MED ORDER — LIDOCAINE HCL 1 % IJ SOLN
INTRAMUSCULAR | Status: AC
Start: 2024-01-30 — End: ?
  Filled 2024-01-30: qty 20

## 2024-01-30 NOTE — Procedures (Signed)
 Patient presents for paracentesis therapeutic. US  limited shows trace amount of abdominal fluid noted  Insufficient to perform a safe paracentesis. Procedure not performed.  Pasty Bongo, PA-C   01/30/2024, 10:17 AM

## 2024-01-31 ENCOUNTER — Other Ambulatory Visit: Payer: Self-pay | Admitting: Family Medicine

## 2024-01-31 DIAGNOSIS — E2839 Other primary ovarian failure: Secondary | ICD-10-CM

## 2024-02-17 ENCOUNTER — Encounter (HOSPITAL_COMMUNITY): Payer: Self-pay | Admitting: Gastroenterology

## 2024-02-24 ENCOUNTER — Encounter (HOSPITAL_COMMUNITY): Payer: Self-pay | Admitting: Gastroenterology

## 2024-02-24 ENCOUNTER — Other Ambulatory Visit: Payer: Self-pay

## 2024-02-24 ENCOUNTER — Encounter (HOSPITAL_COMMUNITY)
Admission: RE | Disposition: A | Discharge status: Home / Self Care | Payer: Self-pay | Source: Home / Self Care | Attending: Gastroenterology

## 2024-02-24 ENCOUNTER — Ambulatory Visit (HOSPITAL_COMMUNITY): Admitting: Anesthesiology

## 2024-02-24 ENCOUNTER — Ambulatory Visit (HOSPITAL_COMMUNITY)
Admission: RE | Admit: 2024-02-24 | Discharge: 2024-02-24 | Disposition: A | Discharge status: Home / Self Care | Payer: Medicare HMO | Attending: Gastroenterology | Admitting: Gastroenterology

## 2024-02-24 DIAGNOSIS — I8510 Secondary esophageal varices without bleeding: Secondary | ICD-10-CM | POA: Diagnosis not present

## 2024-02-24 DIAGNOSIS — Z79899 Other long term (current) drug therapy: Secondary | ICD-10-CM | POA: Diagnosis not present

## 2024-02-24 DIAGNOSIS — K766 Portal hypertension: Secondary | ICD-10-CM

## 2024-02-24 DIAGNOSIS — I8500 Esophageal varices without bleeding: Secondary | ICD-10-CM | POA: Insufficient documentation

## 2024-02-24 DIAGNOSIS — K3189 Other diseases of stomach and duodenum: Secondary | ICD-10-CM

## 2024-02-24 DIAGNOSIS — I10 Essential (primary) hypertension: Secondary | ICD-10-CM | POA: Diagnosis not present

## 2024-02-24 HISTORY — PX: ESOPHAGOGASTRODUODENOSCOPY (EGD) WITH PROPOFOL: SHX5813

## 2024-02-24 SURGERY — ESOPHAGOGASTRODUODENOSCOPY (EGD) WITH PROPOFOL
Anesthesia: Monitor Anesthesia Care

## 2024-02-24 MED ORDER — LIDOCAINE 2% (20 MG/ML) 5 ML SYRINGE
INTRAMUSCULAR | Status: DC | PRN
  Administered 2024-02-24: 40 mg via INTRAVENOUS

## 2024-02-24 MED ORDER — PROPOFOL 500 MG/50ML IV EMUL
INTRAVENOUS | Status: DC | PRN
  Administered 2024-02-24: 180 ug/kg/min via INTRAVENOUS

## 2024-02-24 MED ORDER — PROPOFOL 10 MG/ML IV BOLUS
INTRAVENOUS | Status: DC | PRN
  Administered 2024-02-24: 20 mg via INTRAVENOUS

## 2024-02-24 MED ORDER — PROPOFOL 500 MG/50ML IV EMUL
INTRAVENOUS | Status: AC
  Filled 2024-02-24: qty 150

## 2024-02-24 MED ORDER — PROPOFOL 1000 MG/100ML IV EMUL
INTRAVENOUS | Status: AC
  Filled 2024-02-24: qty 200

## 2024-02-24 MED ORDER — SODIUM CHLORIDE 0.9 % IV SOLN
INTRAVENOUS | Status: AC | PRN
  Administered 2024-02-24: 500 mL via INTRAMUSCULAR

## 2024-02-24 SURGICAL SUPPLY — 14 items

## 2024-02-24 NOTE — Anesthesia Postprocedure Evaluation (Signed)
 Anesthesia Post Note  Patient: Terri Turner  Procedure(s) Performed: ESOPHAGOGASTRODUODENOSCOPY (EGD) WITH PROPOFOL     Patient location during evaluation: PACU Anesthesia Type: MAC Level of consciousness: awake and alert Pain management: pain level controlled Vital Signs Assessment: post-procedure vital signs reviewed and stable Respiratory status: spontaneous breathing, nonlabored ventilation and respiratory function stable Cardiovascular status: stable and blood pressure returned to baseline Anesthetic complications: no   No notable events documented.  Last Vitals:  Vitals:   02/24/24 1240 02/24/24 1250  BP: 115/84 107/73  Pulse: 70 62  Resp: 14 13  Temp:    SpO2: 100% 99%    Last Pain:  Vitals:   02/24/24 1250  TempSrc:   PainSc: 0-No pain                 Beryle Lathe

## 2024-02-24 NOTE — Discharge Instructions (Signed)

## 2024-02-24 NOTE — Op Note (Signed)
 [facility] Patient Name: Terri Turner Procedure Date: 02/24/2024 MRN: [id number] Attending MD: Jeani Hawking , MD, [id number] Date of Birth: 02-27-1946 CSN: [id number] Age: 79 Admit Type: Outpatient Procedure:                Upper GI endoscopy Indications:              Surveillance procedure Providers:                Jeani Hawking, MD, Stephens Shire RN, RN, Rhodia Albright,                            Technician Referring MD:              Medicines:                Propofol per Anesthesia Complications:            No immediate complications. Estimated Blood Loss:     Estimated blood loss: none. Procedure:                Pre-Anesthesia Assessment:                           - Prior to the procedure, a History and Physical                            was performed, and patient medications and                            allergies were reviewed. The patient's tolerance of                            previous anesthesia was also reviewed. The risks                            and benefits of the procedure and the sedation                            options and risks were discussed with the patient.                            All questions were answered, and informed consent                            was obtained. Prior Anticoagulants: The patient has                            taken no anticoagulant or antiplatelet agents. ASA                            Grade Assessment: III - A patient with severe                            systemic disease. After reviewing the risks and  benefits, the patient was deemed in satisfactory                            condition to undergo the procedure.                           - Sedation was administered by an anesthesia                            professional. Deep sedation was attained.                           After obtaining informed consent, the endoscope was                            passed under direct vision.  Throughout the                            procedure, the patient's blood pressure, pulse, and                            oxygen saturations were monitored continuously. The                            [id number]-H190 ([id number]) Olympus endoscope was introduced                            through the mouth, and advanced to the second part                            of duodenum. The upper GI endoscopy was                            accomplished without difficulty. The patient                            tolerated the procedure well. Scope In: Scope Out: Findings:      Small (< 5 mm) varices were found in the middle third of the esophagus       and in the lower third of the esophagus.      Mild portal hypertensive gastropathy was found in the entire examined       stomach.      The examined duodenum was normal.      The varices were small during this procedure. No banding was required       today. Impression:               - Small (< 5 mm) esophageal varices.                           - Portal hypertensive gastropathy.                           - Normal examined duodenum.                           -  No specimens collected. Moderate Sedation:      Not Applicable - Patient had care per Anesthesia. Recommendation:           - Patient has a contact number available for                            emergencies. The signs and symptoms of potential                            delayed complications were discussed with the                            patient. Return to normal activities tomorrow.                            Written discharge instructions were provided to the                            patient.                           - Resume previous diet.                           - Continue present medications.                           - Repeat upper endoscopy in 1 year for surveillance.                           - Follow up with Dr. Loreta Ave as scheduled. Procedure Code(s):        --- Professional ---                            [phone], Esophagogastroduodenoscopy, flexible,                            transoral; diagnostic, including collection of                            specimen(s) by brushing or washing, when performed                            (separate procedure) Diagnosis Code(s):        --- Professional ---                           I85.00, Esophageal varices without bleeding                           K76.6, Portal hypertension                           K31.89, Other diseases of stomach and duodenum CPT copyright 2022 [employer]. All rights reserved. The codes documented in this report are preliminary and upon coder review may  be revised to meet current compliance requirements.  Jeani Hawking, MD Jeani Hawking, MD 02/24/2024 12:36:58 PM This report has been signed electronically. Number of Addenda: 0

## 2024-02-24 NOTE — Transfer of Care (Signed)
 Immediate Anesthesia Transfer of Care Note  Patient: Terri Turner  Procedure(s) Performed: ESOPHAGOGASTRODUODENOSCOPY (EGD) WITH PROPOFOL  Patient Location: Endoscopy Unit  Anesthesia Type:MAC  Level of Consciousness: sedated  Airway & Oxygen Therapy: Patient Spontanous Breathing and Patient connected to nasal cannula oxygen  Post-op Assessment: Report given to RN and Post -op Vital signs reviewed and stable  Post vital signs: Reviewed and stable  Last Vitals:  Vitals Value Taken Time  BP    Temp    Pulse    Resp    SpO2      Last Pain:  Vitals:   02/24/24 1037  TempSrc: Tympanic  PainSc: 0-No pain         Complications: No notable events documented.

## 2024-02-24 NOTE — Anesthesia Preprocedure Evaluation (Addendum)
 Anesthesia Evaluation  Patient identified by MRN, date of birth, ID band Patient awake    Reviewed: Allergy & Precautions, NPO status , Patient's Chart, lab work & pertinent test results  History of Anesthesia Complications Negative for: history of anesthetic complications  Airway Mallampati: III  TM Distance: >3 FB Neck ROM: Full    Dental  (+) Dental Advisory Given, Teeth Intact   Pulmonary neg pulmonary ROS   Pulmonary exam normal        Cardiovascular hypertension, Pt. on medications Normal cardiovascular exam+ Valvular Problems/Murmurs    '17 TTE - Normal EF. Moderate TR    Neuro/Psych  Headaches TIA negative psych ROS   GI/Hepatic negative GI ROS,,,(+)   Esophageal Varices      Endo/Other  negative endocrine ROS    Renal/GU negative Renal ROS     Musculoskeletal negative musculoskeletal ROS (+)    Abdominal   Peds  Hematology negative hematology ROS (+)   Anesthesia Other Findings   Reproductive/Obstetrics                             Anesthesia Physical Anesthesia Plan  ASA: 3  Anesthesia Plan: MAC   Post-op Pain Management: Minimal or no pain anticipated   Induction:   PONV Risk Score and Plan: 2 and Propofol infusion and Treatment may vary due to age or medical condition  Airway Management Planned: Nasal Cannula and Natural Airway  Additional Equipment: None  Intra-op Plan:   Post-operative Plan:   Informed Consent: I have reviewed the patients History and Physical, chart, labs and discussed the procedure including the risks, benefits and alternatives for the proposed anesthesia with the patient or authorized representative who has indicated his/her understanding and acceptance.       Plan Discussed with: CRNA and Anesthesiologist  Anesthesia Plan Comments:         Anesthesia Quick Evaluation

## 2024-02-24 NOTE — H&P (Signed)
 Arlana Pouch HPI: The patient is here for surveillance and possible treatment of her esophageal varices.  Past Medical History:  Diagnosis Date   Headache    Heart murmur    not treated..   Hypertension    TIA (transient ischemic attack)    2017    Past Surgical History:  Procedure Laterality Date   BREAST CYST ASPIRATION Right    CHOLECYSTECTOMY     COLONOSCOPY WITH PROPOFOL N/A 05/20/2023   Procedure: COLONOSCOPY WITH PROPOFOL;  Surgeon: Jeani Hawking, MD;  Location: [facility];  Service: Gastroenterology;  Laterality: N/A;   ESOPHAGEAL BANDING  05/20/2023   Procedure: ESOPHAGEAL BANDING;  Surgeon: Jeani Hawking, MD;  Location: [facility];  Service: Gastroenterology;;   ESOPHAGEAL BANDING N/A 07/01/2023   Procedure: ESOPHAGEAL BANDING;  Surgeon: Jeani Hawking, MD;  Location: [facility];  Service: Gastroenterology;  Laterality: N/A;   ESOPHAGEAL BANDING N/A 09/09/2023   Procedure: ESOPHAGEAL BANDING;  Surgeon: Jeani Hawking, MD;  Location: [facility];  Service: Gastroenterology;  Laterality: N/A;   ESOPHAGOGASTRODUODENOSCOPY (EGD) WITH PROPOFOL N/A 04/17/2021   Procedure: ESOPHAGOGASTRODUODENOSCOPY (EGD) WITH PROPOFOL;  Surgeon: Jeani Hawking, MD;  Location: [facility];  Service: Endoscopy;  Laterality: N/A;   ESOPHAGOGASTRODUODENOSCOPY (EGD) WITH PROPOFOL N/A 05/20/2023   Procedure: ESOPHAGOGASTRODUODENOSCOPY (EGD) WITH PROPOFOL;  Surgeon: Jeani Hawking, MD;  Location: [facility];  Service: Gastroenterology;  Laterality: N/A;   ESOPHAGOGASTRODUODENOSCOPY (EGD) WITH PROPOFOL N/A 07/01/2023   Procedure: ESOPHAGOGASTRODUODENOSCOPY (EGD) WITH PROPOFOL;  Surgeon: Jeani Hawking, MD;  Location: [facility];  Service: Gastroenterology;  Laterality: N/A;   ESOPHAGOGASTRODUODENOSCOPY (EGD) WITH PROPOFOL N/A 09/09/2023   Procedure: ESOPHAGOGASTRODUODENOSCOPY (EGD) WITH PROPOFOL;  Surgeon: Jeani Hawking, MD;  Location: [facility];  Service: Gastroenterology;  Laterality: N/A;    HYSTEROSCOPY WITH D & C N/A 12/29/2015   Procedure: DILATATION AND CURETTAGE /HYSTEROSCOPY;  Surgeon: Myna Hidalgo, DO;  Location: [facility];  Service: Gynecology;  Laterality: N/A;   POLYPECTOMY  05/20/2023   Procedure: POLYPECTOMY;  Surgeon: Jeani Hawking, MD;  Location: [facility];  Service: Gastroenterology;;   URINARY SURGERY     mesh    WRIST SURGERY     left thumb (pin)    Family History  Problem Relation Age of Onset   Hypertension Mother    Stroke Father    Prostate cancer Brother    Hypertension Sister    High blood pressure Sister    Alzheimer's disease Sister     Social History:  reports that she has never smoked. She has never used smokeless tobacco. She reports that she does not drink alcohol and does not use drugs.  Allergies:  Allergies  Allergen Reactions   Nortriptyline     Hallucinations     Medications: Scheduled: Continuous:  sodium chloride 500 mL (02/24/24 1045)    No results found for this or any previous visit (from the past 24 hours).   No results found.  ROS:  As stated above in the HPI otherwise negative.  Blood pressure (!) 140/65, pulse 67, temperature 98.1 F (36.7 C), temperature source Tympanic, height 5' (1.524 m), weight 56.2 kg, SpO2 100%.    PE: Gen: NAD, Alert and Oriented HEENT:  [place]/AT, EOMI Neck: Supple, no LAD Lungs: CTA Bilaterally CV: RRR without M/G/R ABD: Soft, NTND, +BS Ext: No C/C/E  Assessment/Plan: 1) Esophageal varices - EGD with possible banding.  Lorance Pickeral D 02/24/2024, 10:54 AM

## 2024-02-26 ENCOUNTER — Encounter (HOSPITAL_COMMUNITY): Payer: Self-pay | Admitting: Gastroenterology

## 2024-04-05 ENCOUNTER — Other Ambulatory Visit (HOSPITAL_COMMUNITY): Payer: Self-pay | Admitting: Internal Medicine

## 2024-04-05 DIAGNOSIS — R188 Other ascites: Secondary | ICD-10-CM

## 2024-04-05 DIAGNOSIS — K7460 Unspecified cirrhosis of liver: Secondary | ICD-10-CM

## 2024-04-06 ENCOUNTER — Ambulatory Visit (HOSPITAL_COMMUNITY)
Admission: RE | Admit: 2024-04-06 | Discharge: 2024-04-06 | Disposition: A | Discharge status: Home / Self Care | Source: Ambulatory Visit | Attending: Internal Medicine | Admitting: Internal Medicine

## 2024-04-06 DIAGNOSIS — R188 Other ascites: Secondary | ICD-10-CM | POA: Insufficient documentation

## 2024-04-06 DIAGNOSIS — K7460 Unspecified cirrhosis of liver: Secondary | ICD-10-CM | POA: Insufficient documentation

## 2024-04-06 HISTORY — PX: IR PARACENTESIS: IMG2679

## 2024-04-06 MED ORDER — LIDOCAINE HCL 1 % IJ SOLN: 20.0000 mL | Freq: Once | INTRAMUSCULAR | Status: DC

## 2024-04-06 NOTE — Procedures (Signed)
 PROCEDURE SUMMARY:  Successful image-guided paracentesis from the right lower abdomen.  Yielded 2 liters of hazy yellow fluid.  No immediate complications.  EBL = trace. Patient tolerated well.   Specimen was not sent for labs.  Please see imaging section of Epic for full dictation.  If the patient eventually requires >/=2 paracenteses in a 30 day period, screening evaluation by the [facility] will be assessed.   Tiphany Fayson H Torrance Frech PA-C 04/06/2024 1:18 PM

## 2024-04-23 ENCOUNTER — Emergency Department (HOSPITAL_BASED_OUTPATIENT_CLINIC_OR_DEPARTMENT_OTHER): Admission: EM | Admit: 2024-04-23 | Discharge: 2024-04-23 | Disposition: A | Discharge status: Home / Self Care

## 2024-04-23 ENCOUNTER — Encounter (HOSPITAL_BASED_OUTPATIENT_CLINIC_OR_DEPARTMENT_OTHER): Payer: Self-pay

## 2024-04-23 ENCOUNTER — Other Ambulatory Visit: Payer: Self-pay

## 2024-04-23 DIAGNOSIS — R200 Anesthesia of skin: Secondary | ICD-10-CM | POA: Diagnosis not present

## 2024-04-23 DIAGNOSIS — R252 Cramp and spasm: Secondary | ICD-10-CM | POA: Insufficient documentation

## 2024-04-23 HISTORY — DX: Esophageal varices without bleeding: I85.00

## 2024-04-23 HISTORY — DX: Unspecified cirrhosis of liver: K74.60

## 2024-04-23 MED ORDER — GABAPENTIN 300 MG PO CAPS: 300.0000 mg | ORAL_CAPSULE | Freq: Two times a day (BID) | ORAL | 0 refills | Status: AC

## 2024-04-23 NOTE — Discharge Instructions (Signed)
 Please follow-up with your primary doctor.  Return to develop fevers, chills, chest pain, shortness of breath, worsening pain, persistent numbness, bowel or bladder incontinence, numbness in your genital area, or any any new or worsening symptoms that are concerning to you.

## 2024-04-23 NOTE — ED Provider Notes (Signed)
 [place] EMERGENCY DEPARTMENT AT [facility] Provider Note   CSN: [id number] Arrival date & time: 04/23/24  7829     History  Chief Complaint  Patient presents with   Foot Problem    Terri Turner is a 78 y.o. female.  78 year old female presenting emergency department for intermittent muscle cramping and numbness to bilateral feet.  Symptom are intermittent and currently asymptomatic.  Describes cramping like sensation and numbness to the 3rd, 4th and 5th feet.  Symptoms not worsened by movement.  Low risk for DVT based on Wells criteria.  No fevers no chills no chest pain no shortness of breath no abdominal pain.  Triage note mentions problem with hands, patient states that these are related to arthritis and unchanged.        Home Medications Prior to Admission medications   Medication Sig Start Date End Date Taking? Authorizing Provider  Ascorbic Acid (VITAMIN C PO) Take 1 tablet by mouth 3 (three) times a week.    [provider]  Brimonidine Tartrate (LUMIFY) 0.025 % SOLN Place 1 drop into both eyes daily.    [provider]  Calcium  Carb-Cholecalciferol  (CALCIUM  + VITAMIN D3 PO) Take 1 tablet by mouth in the morning.    [provider]  carboxymethylcellulose (REFRESH PLUS) 0.5 % SOLN Place 1 drop into both eyes in the morning.    [provider]  carvedilol (COREG) 3.125 MG tablet Take 3.125 mg by mouth once.    [provider]  famotidine  (PEPCID ) 40 MG tablet Take 40 mg by mouth daily as needed for heartburn or indigestion. 10/21/20   [provider]  FIBER ADULT GUMMIES PO Take 1 each by mouth daily.    [provider]  furosemide (LASIX) 40 MG tablet Take 40 mg by mouth daily.    [provider]  GINKGO BILOBA PO Take 2 capsules by mouth in the morning.    [provider]  Melatonin 10 MG CHEW Chew 20 mg by mouth at bedtime.    [provider]  Multiple  Vitamins-Minerals (PRESERVISION AREDS 2) CAPS Take 2 capsules by mouth in the morning.    [provider]  nadolol (CORGARD) 40 MG tablet Take 40 mg by mouth in the morning. Patient not taking: Reported on 02/17/2024    [provider]  omeprazole -sodium bicarbonate (ZEGERID) 40-1100 MG capsule Take 1 capsule by mouth daily. Patient not taking: Reported on 02/17/2024 05/24/23   [provider]  spironolactone (ALDACTONE) 100 MG tablet Take 100 mg by mouth once.    [provider]  Ubrogepant  (UBRELVY ) 50 MG TABS Take 50 mg by mouth as needed (take 1 tablet at onset of headache, may repeat 2 hours later if needed). 11/19/20   Wess Hammed, NP      Allergies    Nortriptyline     Review of Systems   Review of Systems  Physical Exam Updated Vital Signs BP (!) 144/75 (BP Location: Right Arm)   Pulse 63   Temp 98.2 F (36.8 C) (Oral)   Resp 16   Ht 5' (1.524 m)   Wt 61.2 kg   SpO2 98%   BMI 26.37 kg/m  Physical Exam Vitals and nursing note reviewed.  Constitutional:      General: She is not in acute distress.    Appearance: She is not toxic-appearing.  HENT:     Head: Normocephalic.     Nose: Nose normal.     Mouth/Throat:  Mouth: Mucous membranes are moist.  Eyes:     Conjunctiva/sclera: Conjunctivae normal.  Cardiovascular:     Rate and Rhythm: Normal rate and regular rhythm.  Pulmonary:     Effort: Pulmonary effort is normal.     Breath sounds: Normal breath sounds.  Abdominal:     General: Abdomen is flat. There is no distension.     Tenderness: There is no abdominal tenderness. There is no guarding or rebound.  Musculoskeletal:     Right lower leg: No edema.     Left lower leg: No edema.     Comments: Bilateral lower extremities equal size.  No erythema.  Normal warmth.  2+ DP pulses bilaterally.  Neurovascularly intact.  Normal sensation  Skin:    General: Skin is warm.     Capillary Refill: Capillary refill takes less than 2  seconds.  Neurological:     Mental Status: She is alert and oriented to person, place, and time.  Psychiatric:        Mood and Affect: Mood normal.        Behavior: Behavior normal.     ED Results / Procedures / Treatments   Labs (all labs ordered are listed, but only abnormal results are displayed) Labs Reviewed - No data to display  EKG None  Radiology No results found.  Procedures Procedures    Medications Ordered in ED Medications - No data to display  ED Course/ Medical Decision Making/ A&P Clinical Course as of 04/23/24 1411  Mon Apr 23, 2024  1347 CTA aorta CAP done last year with no osseous abnormalities per chart review.  [TY]    Clinical Course User Index [TY] Rolinda Climes, DO                                 Medical Decision Making 78 year old female presents emergency department for evaluation of intermittent cramping/numbness to her bilateral feet from the 3rd, 4th and 5th toes.  She is afebrile nontachycardic, slightly hypertensive.  Currently asymptomatic.  Suspicion for intracranial pathology with normal neuroexam.  She is not having back pain to suggest cord compression, no other red flags either.  Low risk for PE/DVT based on Wells criteria.  Clinically does not appear to have DVT either.  Soft compartments.  Warm and well-perfused extremities; low suspicion for acute limb ischemia.  Unclear etiology for her symptoms.  Offered basic labs, but patient declined.  She states she only came because her primary doctor's office triage nurse told her to come to the emergency department.  Will discharge patient to follow-up primary doctor.  Given strict return precautions.          Final Clinical Impression(s) / ED Diagnoses Final diagnoses:  None    Rx / DC Orders ED Discharge Orders     None         Rolinda Climes, DO 04/23/24 1411

## 2024-04-23 NOTE — ED Triage Notes (Signed)
 In for eval of intermittent numbness to 3rd - 5th toes on bilateral feet. Standing for a long period or walking resolves the numbness typically. Worse at night. Having similar problems with middle on bilateral hands.

## 2024-06-26 ENCOUNTER — Other Ambulatory Visit: Payer: Self-pay | Admitting: Internal Medicine

## 2024-06-26 DIAGNOSIS — R188 Other ascites: Secondary | ICD-10-CM

## 2024-06-27 ENCOUNTER — Encounter: Payer: Self-pay | Admitting: Internal Medicine

## 2024-06-28 ENCOUNTER — Ambulatory Visit
Admission: RE | Admit: 2024-06-28 | Discharge: 2024-06-28 | Disposition: A | Discharge status: Home / Self Care | Source: Ambulatory Visit | Attending: Internal Medicine | Admitting: Internal Medicine

## 2024-06-28 DIAGNOSIS — R188 Other ascites: Secondary | ICD-10-CM

## 2024-09-25 ENCOUNTER — Other Ambulatory Visit (HOSPITAL_BASED_OUTPATIENT_CLINIC_OR_DEPARTMENT_OTHER)

## 2024-09-25 ENCOUNTER — Other Ambulatory Visit: Payer: Medicare HMO

## 2024-10-09 ENCOUNTER — Ambulatory Visit (HOSPITAL_BASED_OUTPATIENT_CLINIC_OR_DEPARTMENT_OTHER)
Admission: RE | Admit: 2024-10-09 | Discharge: 2024-10-09 | Disposition: A | Discharge status: Home / Self Care | Source: Ambulatory Visit | Attending: Family Medicine | Admitting: Family Medicine

## 2024-10-09 DIAGNOSIS — E2839 Other primary ovarian failure: Secondary | ICD-10-CM | POA: Diagnosis present

## 2024-11-27 ENCOUNTER — Encounter: Payer: Self-pay | Admitting: *Deleted

## 2024-11-27 ENCOUNTER — Other Ambulatory Visit (HOSPITAL_COMMUNITY): Payer: Self-pay | Admitting: Gastroenterology

## 2024-11-27 DIAGNOSIS — R188 Other ascites: Secondary | ICD-10-CM

## 2024-12-04 ENCOUNTER — Inpatient Hospital Stay (HOSPITAL_COMMUNITY): Admission: RE | Admit: 2024-12-04 | Discharge: 2024-12-04 | Attending: Gastroenterology | Admitting: Gastroenterology

## 2024-12-04 DIAGNOSIS — R188 Other ascites: Secondary | ICD-10-CM | POA: Diagnosis present

## 2024-12-04 HISTORY — PX: IR PARACENTESIS: IMG2679

## 2024-12-04 MED ORDER — LIDOCAINE-EPINEPHRINE 1 %-1:100000 IJ SOLN
INTRAMUSCULAR | Status: AC
  Filled 2024-12-04: qty 1

## 2024-12-04 MED ORDER — LIDOCAINE-EPINEPHRINE 1 %-1:100000 IJ SOLN
20.0000 mL | Freq: Once | INTRAMUSCULAR | Status: AC
  Administered 2024-12-04: 10:00:00 9 mL via INTRADERMAL

## 2024-12-04 NOTE — Procedures (Signed)
 PROCEDURE SUMMARY:  Successful image-guided paracentesis from the left abdomen.  Yielded 1.3 liters of clear, yellow fluid.  No immediate complications.  EBL: zero Patient tolerated well.   Specimen not sent for labs.  Please see imaging section of Epic for full dictation.  Emersyn Wyss NP 12/04/2024 10:58 AM

## 2024-12-11 ENCOUNTER — Ambulatory Visit (HOSPITAL_COMMUNITY)
